# Patient Record
Sex: Female | Born: 1970 | Hispanic: Yes | Marital: Married | Smoking: Never smoker
Health system: Southern US, Community
[De-identification: ages and names within clinical notes are randomized; demographics above are authoritative.]

## PROBLEM LIST (undated history)

## (undated) DIAGNOSIS — I10 Essential (primary) hypertension: Secondary | ICD-10-CM

## (undated) DIAGNOSIS — E119 Type 2 diabetes mellitus without complications: Secondary | ICD-10-CM

## (undated) HISTORY — PX: HERNIA REPAIR: SHX51

---

## 1998-12-30 ENCOUNTER — Other Ambulatory Visit: Admission: RE | Admit: 1998-12-30 | Discharge: 1998-12-30 | Payer: Self-pay | Admitting: Obstetrics

## 1999-05-19 ENCOUNTER — Encounter: Admission: RE | Admit: 1999-05-19 | Discharge: 1999-05-19 | Payer: Self-pay | Admitting: Obstetrics & Gynecology

## 2014-10-01 ENCOUNTER — Encounter: Payer: Self-pay | Admitting: Family Medicine

## 2014-10-01 ENCOUNTER — Ambulatory Visit (INDEPENDENT_AMBULATORY_CARE_PROVIDER_SITE_OTHER): Payer: No Typology Code available for payment source | Admitting: Family Medicine

## 2014-10-01 VITALS — BP 125/82 | HR 70 | Temp 98.1°F | Resp 16 | Ht 66.0 in | Wt 188.0 lb

## 2014-10-01 DIAGNOSIS — R5383 Other fatigue: Secondary | ICD-10-CM

## 2014-10-01 DIAGNOSIS — Z789 Other specified health status: Secondary | ICD-10-CM | POA: Insufficient documentation

## 2014-10-01 DIAGNOSIS — N951 Menopausal and female climacteric states: Secondary | ICD-10-CM | POA: Insufficient documentation

## 2014-10-01 DIAGNOSIS — E8881 Metabolic syndrome: Secondary | ICD-10-CM

## 2014-10-01 DIAGNOSIS — R739 Hyperglycemia, unspecified: Secondary | ICD-10-CM

## 2014-10-01 DIAGNOSIS — Z23 Encounter for immunization: Secondary | ICD-10-CM

## 2014-10-01 LAB — COMPLETE METABOLIC PANEL WITH GFR
ALT: 15 U/L (ref 0–35)
AST: 14 U/L (ref 0–37)
Albumin: 4.2 g/dL (ref 3.5–5.2)
Alkaline Phosphatase: 82 U/L (ref 39–117)
BILIRUBIN TOTAL: 0.2 mg/dL (ref 0.2–1.2)
BUN: 13 mg/dL (ref 6–23)
CALCIUM: 9 mg/dL (ref 8.4–10.5)
CO2: 23 mEq/L (ref 19–32)
CREATININE: 0.54 mg/dL (ref 0.50–1.10)
Chloride: 101 mEq/L (ref 96–112)
Glucose, Bld: 164 mg/dL — ABNORMAL HIGH (ref 70–99)
POTASSIUM: 3.7 meq/L (ref 3.5–5.3)
Sodium: 135 mEq/L (ref 135–145)
TOTAL PROTEIN: 7.3 g/dL (ref 6.0–8.3)

## 2014-10-01 LAB — GLUCOSE, CAPILLARY: GLUCOSE-CAPILLARY: 153 mg/dL — AB (ref 70–99)

## 2014-10-01 LAB — LIPID PANEL
CHOL/HDL RATIO: 5.8 ratio
CHOLESTEROL: 238 mg/dL — AB (ref 0–200)
HDL: 41 mg/dL (ref >39)
TRIGLYCERIDES: 434 mg/dL — AB (ref <150)

## 2014-10-01 NOTE — Progress Notes (Signed)
Subjective:    Patient ID: Katrina [name], female    DOB: 02/06/1971, 44 y.o.   MRN: [id number]  HPI [name], a 44 year old female presents accompanied by spanish interpreter. She understands [place], but primarily speaks spanish. She states that she has been using the emergency department and urgent care for primary needs.  Patient presents complaining of hyperglycemia  Symptoms: hyperglycemia, polydipsia and polyuria. Symptoms have been intermittent. Patient denies nausea, visual disturbances, vomitting and weight loss. She states that she purchased a glucometer and blood sugars have been consistently increased.  Home sugars: BGs range between 170 and 200. She states that she has a strong family history of diabetes and had gestational diabetes during her last pregnancy.   Patient complains of fatigue. Symptoms began several months ago. Sentinal symptom the patient feels fatigue began with: significant change in weight. Symptoms of her fatigue have been diffuse soft tissue aches and pains, headaches and lack of interest in usual activities.  Symptoms have been intermittent. Severity has been symptoms bothersome, but easily able to carry out all usual work/school/family activities.   . History reviewed. No pertinent past medical history.  History   Social History  . Marital Status: Unknown    Spouse Name: N/A  . Number of Children: N/A  . Years of Education: N/A   Occupational History  . Not on file.   Social History Main Topics  . Smoking status: Never Smoker   . Smokeless tobacco: Not on file  . Alcohol Use: No  . Drug Use: Not on file  . Sexual Activity: Yes   Other Topics Concern  . Not on file   Social History Narrative  . No narrative on file   Review of Systems  Constitutional: Negative.   HENT: Negative.   Eyes: Negative.   Respiratory: Negative.   Cardiovascular: Negative.   Gastrointestinal: Negative.  Negative for vomiting and rectal pain.  Endocrine:  Positive for polydipsia, polyphagia and polyuria.  Genitourinary: Negative.  Negative for menstrual problem.  Musculoskeletal: Negative.   Skin: Negative.   Hematological: Negative.   Psychiatric/Behavioral: Negative.        Objective:   Physical Exam  Constitutional: She is oriented to person, place, and time. She appears well-developed and well-nourished.  HENT:  Head: Normocephalic and atraumatic.  Right Ear: External ear normal.  Left Ear: External ear normal.  Mouth/Throat: Oropharynx is clear and moist.  Eyes: Pupils are equal, round, and reactive to light.  Neck: Normal range of motion. Neck supple.  Cardiovascular: Normal rate, regular rhythm, normal heart sounds and intact distal pulses.   Pulmonary/Chest: Effort normal and breath sounds normal.  Abdominal: Soft. Bowel sounds are normal.  Neurological: She is alert and oriented to person, place, and time. She has normal reflexes.  Skin: Skin is warm and dry.  Psychiatric: She has a normal mood and affect. Her behavior is normal. Judgment and thought content normal.         BP 125/82 mmHg  Pulse 70  Temp(Src) 98.1 F (36.7 C) (Oral)  Resp 16  Ht 5\' 6"  (1.676 m)  Wt 188 lb (85.276 kg)  BMI 30.36 kg/m2  LMP 08/20/2014 Assessment & Plan:  1. Hyperglycemia [name] states that she had gestational diabetes during pregnancy and a strong family history of diabetes mellitus. She states that she has been feeling fatigued for the past several months. She reports polyuria,  Polydipsia, and  polyphagia. She purchased a glucometer from the pharmacy and her  blood sugars have averaged between 170 and 200 daily. I verify hemoglobin A1C and  add Metformin 500 mg BID daily to medication regimen.  - Hemoglobin A1c - Glucose (CBG) - Urinalysis, Complete  2.Other fatigue She states that she feels tired on most days. She denies depression and anxiety.PHq-9 within normal limits. She states that she sleeps 5-6 hours per night. She  does not exercise nor follow a routine diet or exercise plan. I will check labs. I recommend a lowfat, low carbohydrate diet divided over 6 small meals. I also recommend that Katrina [name] start a low impact cardiovascular exercise plan 2-3 times per week for 30 minutes. She was provided written information in Spanish on diet and exercise regimen.  - TSH - CBC with Differential   3. Metabolic syndrome Katrina [name] maintains that she has gained a significant amount of weight since the birth of her last child, who is currently 55 years old. I will check labs.  - TSH - Lipid Panel - COMPLETE METABOLIC PANEL WITH GFR  4. Need for Tdap vaccination - Tdap vaccine greater than or equal to 7yo IM  5. Need for prophylactic vaccination and inoculation against influenza  - Flu Vaccine QUAD 36+ mos PF IM (Fluarix Quad PF)  6. Language barrier to communication Katrina [name] is accompanied by interpreter, she understands some english and speaks very little.     RTC: 1 month hyperglycemia Katrina [name] M, FNP

## 2014-10-01 NOTE — Patient Instructions (Signed)
Nivel elevado de azcar en sangre (High Blood Sugar) El nivel elevado de azcar en la sangre (hiperglucemia) significa que es mayor de lo que debera ser. Entre los signos de hiperglucemia se incluyen:  Sensacin de sed.  Orinar con frecuencia.  Sentirse cansado o con sueo.  [employer].  Cambios en la visin.  Sensacin de debilidad.  Sensacin de [place], Biomedical engineer con prdida de [place].  Adormecimiento u hormigueo en las manos o en los pies.  Dolor de [place]. Si se ignoran estas seales, el azcar en la sangre puede seguir subiendo. Estos problemas Buyer, retail, y otros problemas pueden Games developer. CUIDADOS EN EL HOGAR  Controle los niveles de Production assistant, radio segn le haya indicado el mdico. Anote los valores con la fecha y la hora.  Tome la cantidad correcta de insulina o pldoras para la diabetes en el momento adecuado. Anote las dosis con la fecha y Public relations account executive.  Reponga la insulina o las pldoras para la diabetes antes de que se le acaben.  Controle lo que come. Siga el plan de alimentacin.  Beba lquidos sin azcar, como agua. Verifique con su mdico si tiene trastornos del rin o del corazn.  Siga las instrucciones del mdico para hacer ejercicio. Haga actividad fsica en el mismo momento del da.  Cumpla con las citas mdicas. SOLICITE AYUDA DE INMEDIATO SI:   Presenta dificultades para pensar o se siente confundido.  Tiene respiracin rpida y su aliento tiene [place] a frutas.  Se desmaya.  Lleva 2 a 3 das con [employer] de Banker y no sabe la razn.  Siente dolor en el pecho.  Tiene Programme researcher, broadcasting/film/video (nuseas) y vmitos.  Tiene cambios repentinos en la vista. ASEGRESE DE QUE:   Comprende estas instrucciones.  Controlar su enfermedad.  Solicitar ayuda de inmediato si no mejora o empeora. Document Released: 09/01/2010 Document Revised: 10/22/2011 [facility] Patient Information 2015 [place], [place]. This information is not intended to  replace advice given to you by your health care provider. Make sure you discuss any questions you have with your health care provider. Recuento bsico de carbohidratos para la diabetes mellitus (Basic Carbohydrate Counting for Diabetes Mellitus) El recuento de carbohidratos es un mtodo destinado a calcular la cantidad de carbohidratos en la dieta. El consumo de carbohidratos aumenta naturalmente el nivel de azcar (glucosa) en la sangre, por lo que es importante que sepa la cantidad que debe incluir en cada comida. El recuento de carbohidratos ayuda a Futures trader de glucosa en la sangre dentro de los lmites normales. La cantidad permitida de carbohidratos es diferente para cada persona. Un nutricionista puede ayudarlo a calcular la cantidad adecuada para usted. Una vez que sepa la cantidad de carbohidratos que puede consumir, podr calcular los carbohidratos de los alimentos que desea comer. Los siguientes alimentos incluyen carbohidratos:  Granos, como panes y cereales.  Frijoles secos y productos con soja.  Vegetales almidonados, como papas, guisantes y maz.  Nils Pyle y jugos de frutas.  Leche y Dentist.  Dulces y bocadillos, como pastel, galletas, caramelos, papas fritas de bolsa, refrescos y bebidas frutales con azcar. RECUENTO DE CARBOHIDRATOS [employer] de calcular los carbohidratos de los alimentos. Puede usar cualquiera de [address] o [place] combinacin de [place]. Leer la etiqueta de informacin nutricional de los alimentos envasados La informacin nutricional es una etiqueta incluida en casi todas las bebidas y los alimentos envasados de los [place]. Indica el tamao de la porcin de ese alimento o bebida  e informacin sobre los nutrientes de cada porcin, incluso los gramos (g) de carbohidratos por porcin.  Decida la cantidad de porciones que comer o tomar de este alimento o bebida. Multiplique la cantidad de porciones por el nmero de gramos de carbohidratos  indicados en la etiqueta para esa porcin. El total ser la cantidad de carbohidratos que consumir al comer ese alimento o tomar esa bebida. Conocer las porciones estndar de los alimentos Cuando coma alimentos no envasados o que no incluyan la informacin nutricional en la etiqueta, deber medir las porciones para poder calcular la cantidad de carbohidratos. Una porcin de la mayora de los alimentos ricos en carbohidratos contiene alrededor de 15g de carbohidratos. La siguiente [employer] tamaos de porcin de los alimentos ricos en carbohidratos que contienen alrededor de 15g de carbohidratos por porcin:   1rebanada de pan (1oz) o 1tortilla de seis pulgadas.  panecillo de hamburguesa o bollito tipo ingls.  4a 6galletas.   de taza de cereal sin azcar y seco.   taza de cereal caliente.   de taza de arroz o pastas.  taza de pur de papas o de una papa grande al horno.  1taza de frutas frescas o una fruta pequea.  taza de frutas o jugo de frutas enlatados o congelados.  1 taza [employer].   de taza de yogur descremado sin ningn agregado o de yogur endulzado con edulcorante artificial.  taza de vegetales almidonados, como guisantes, maz o papas, o de frijoles secos cocidos. Decida la cantidad de porciones Advertising copywriter. Multiplique la cantidad de porciones por 15 (los gramos de carbohidratos en esa porcin). Por ejemplo, si come 2tazas de fresas, habr comido 2porciones y 30g de carbohidratos (2porciones x 15g = 30g). Para las comidas como sopas y guisos, en las que se mezcla ms de un alimento, deber [place] [employer] carbohidratos de cada alimento incluido. EJEMPLO DE RECUENTO DE CARBOHIDRATOS Ejemplo de cena  3 onzas de pechugas de pollo.   de taza de arroz integral.   taza de maz.  1 taza de [place].  1 taza de fresas con crema batida sin azcar. Clculo de carbohidratos [place] 1: Identifique los alimentos que contienen carbohidratos:     Arroz.  Maz.  Leche.  Jinny Sanders. [place] 2: Calcule el nmero de porciones que consumir de cada uno:   2 porciones de Surveyor, minerals.  1 porcin de maz.  1 porcin de leche.  1 porcin de fresas. [place] 3: Multiplique cada una de esas porciones por 15g:   2 porciones de arroz x 15 g = 30 g.  1 porcin de maz x 15 g = 15 g.  1 porcin de leche x 15 g = 15 g.  1 porcin de fresas x 15 g = 15 g. [place] 4: Sume todas las cantidades para Artist total de gramos de carbohidratos consumidos: 30 g + 15 g + 15 g + 15 g = 75 g. Document Released: 10/22/2011 Document Revised: 12/14/2013 [facility] Patient Information 2015 [place], [place]. This information is not intended to replace advice given to you by your health care provider. Make sure you discuss any questions you have with your health care provider. Recuento bsico de carbohidratos para la diabetes mellitus (Basic Carbohydrate Counting for Diabetes Mellitus) El recuento de carbohidratos es un mtodo destinado a calcular la cantidad de carbohidratos en la dieta. El consumo de carbohidratos aumenta naturalmente el nivel de azcar (glucosa) en la sangre, por lo que es importante que sepa la cantidad que debe  incluir en cada comida. El recuento de carbohidratos ayuda a Futures trader de glucosa en la sangre dentro de los lmites normales. La cantidad permitida de carbohidratos es diferente para cada persona. Un nutricionista puede ayudarlo a calcular la cantidad adecuada para usted. Una vez que sepa la cantidad de carbohidratos que puede consumir, podr calcular los carbohidratos de los alimentos que desea comer. Los siguientes alimentos incluyen carbohidratos:  Granos, como panes y cereales.  Frijoles secos y productos con soja.  Vegetales almidonados, como papas, guisantes y maz.  Nils Pyle y jugos de frutas.  Leche y Dentist.  Dulces y bocadillos, como pastel, galletas, caramelos, papas fritas de bolsa, refrescos y bebidas frutales con  azcar. RECUENTO DE CARBOHIDRATOS [employer] de calcular los carbohidratos de los alimentos. Puede usar cualquiera de [address] o [place] combinacin de [place]. Leer la etiqueta de informacin nutricional de los alimentos envasados La informacin nutricional es una etiqueta incluida en casi todas las bebidas y los alimentos envasados de los [place]. [place] el tamao de la porcin de ese alimento o bebida e informacin sobre los nutrientes de cada porcin, incluso los gramos (g) de carbohidratos por porcin.  Decida la cantidad de porciones que comer o tomar de este alimento o bebida. Multiplique la cantidad de porciones por el nmero de gramos de carbohidratos indicados en la etiqueta para esa porcin. El total ser la cantidad de carbohidratos que consumir al comer ese alimento o tomar esa bebida. Conocer las porciones estndar de los alimentos Cuando coma alimentos no envasados o que no incluyan la informacin nutricional en la etiqueta, deber medir las porciones para poder calcular la cantidad de carbohidratos. Una porcin de la mayora de los alimentos ricos en carbohidratos contiene alrededor de 15g de carbohidratos. La siguiente [employer] tamaos de porcin de los alimentos ricos en carbohidratos que contienen alrededor de 15g de carbohidratos por porcin:   1rebanada de pan (1oz) o 1tortilla de seis pulgadas.  panecillo de hamburguesa o bollito tipo ingls.  4a 6galletas.   de taza de cereal sin azcar y seco.   taza de cereal caliente.   de taza de arroz o pastas.  taza de pur de papas o de una papa grande al horno.  1taza de frutas frescas o una fruta pequea.  taza de frutas o jugo de frutas enlatados o congelados.  1 taza [employer].   de taza de yogur descremado sin ningn agregado o de yogur endulzado con edulcorante artificial.  taza de vegetales almidonados, como guisantes, maz o papas, o de frijoles secos cocidos. Decida la  cantidad de porciones Advertising copywriter. Multiplique la cantidad de porciones por 15 (los gramos de carbohidratos en esa porcin). Por ejemplo, si come 2tazas de fresas, habr comido 2porciones y 30g de carbohidratos (2porciones x 15g = 30g). Para las comidas como sopas y guisos, en las que se mezcla ms de un alimento, deber [employer] carbohidratos de cada alimento incluido. EJEMPLO DE RECUENTO DE CARBOHIDRATOS Ejemplo de cena  3 onzas de pechugas de pollo.   de taza de arroz integral.   taza de maz.  1 taza de [place].  1 taza de fresas con crema batida sin azcar. Clculo de carbohidratos [place] 1: Identifique los alimentos que contienen carbohidratos:   Arroz.  Maz.  Leche.  Jinny Sanders. [place] 2: Calcule el nmero de porciones que consumir de cada uno:   2 porciones de Surveyor, minerals.  1 porcin de maz.  1 porcin de leche.  1  porcin de fresas. [place] 3: Multiplique cada una de esas porciones por 15g:   2 porciones de arroz x 15 g = 30 g.  1 porcin de maz x 15 g = 15 g.  1 porcin de leche x 15 g = 15 g.  1 porcin de fresas x 15 g = 15 g. [place] 4: Sume todas las cantidades para Artist total de gramos de carbohidratos consumidos: 30 g + 15 g + 15 g + 15 g = 75 g. Document Released: 10/22/2011 Document Revised: 12/14/2013 [facility] Patient Information 2015 [place], [place]. This information is not intended to replace advice given to you by your health care provider. Make sure you discuss any questions you have with your health care provider. Diabetes y Doroteo Glassman fsica (Diabetes and Exercise) Hacer actividad fsica con regularidad es muy importante. No se trata solo de [employer]. Tiene muchos otros beneficios, como por ejemplo:  Mejorar el estado fsico, la flexibilidad y la resistencia.  Aumenta la densidad sea.  Ayuda a Art gallery manager.  Disminuye la Art gallery manager.  Aumenta la fuerza muscular.  Reduce el estrs y las tensiones.  Mejora el estado  de salud general. Las personas diabticas que realizan actividad fsica tienen beneficios adicionales debido al ejercicio:  Reduce el apetito.  El organismo mejora el uso del azcar (glucosa) de la [place].  Ayuda a disminuir o Engineer, maintenance (IT).  Disminuye la presin arterial.  Ayuda a disminuir los lpidos en la sangre (colesterol y triglicridos).  El organismo mejora el uso de la insulina porque:  Aumenta la sensibilidad del organismo a la insulina.  Reduce las necesidades de insulina del organismo.  Disminuye el riesgo de enfermedad cardaca por la actividad fsica ya que  disminuye el colesterol y [employer] triglicridos.  Aumenta los niveles de colesterol bueno (como las lipoprotenas de alta densidad [HDL]) en el organismo.  Disminuye los niveles de glucosa en la [place]. SU PLAN DE ACTIVIDAD  Elija una actividad que disfrute y establezca objetivos realistas. Su mdico o educador en diabetes podrn ayudarlo a encontrar una actividad que lo beneficie. Haga ejercicio regularmente como se lo haya indicado el mdico. Esto incluye:  Hacer entrenamiento de [employer] a la semana, como flexiones, sentadillas, levantar peso o usar bandas de resistencia.  Practicar de ejercicios cardiovasculares cada semana, como caminar, correr o hacer algn deporte.  Mantenerse activo y no permanecer inactivo durante ms de seguidos. Los perodos cortos de [place] tambin son beneficiosos. Tres sesiones de a lo largo del da son tan beneficiosas como una sola sesin de . Estas son algunas ideas para los ejercicios:  Lleve a Multimedia programmer.  Utilice las Microbiologist del ascensor.  Baile su cancin favorita.  Haga los ejercicios de un video de ejercicios.  Haga sus ejercicios favoritos con Leisure centre manager. RECOMENDACIONES PARA REALIZAR EJERCICIOS CUANDO SE TIENE DIABETES TIPO 1 O TIPO 2   Controle la glucosa en la  sangre antes de comenzar. Si el nivel de glucosa en la sangre es de ms de 240 mg/dl, controle las cetonas en la [place]. No haga actividad fsica si hay cetonas.  Evite inyectarse insulina en las zonas del cuerpo que ejercitar. Por ejemplo, evite inyectarse insulina en:  Los brazos, si juega al tenis.  Las piernas, si corre.  Lleve un registro de:  Los alimentos que consume antes y despus de Tour manager.  Los momentos esperables de picos de accin de la insulina.  Los niveles de glucosa en la sangre antes y despus de hacer ejercicios.  El tipo y cantidad de [place] fsica que Biomedical engineer.  Revise los registros con su mdico. El mdico lo ayudar a Environmental education officer pautas para ajustar la cantidad de alimento y las cantidades de insulina antes y despus de Radio producer ejercicios.  Si toma insulina o agentes hipoglucemiantes por va oral, observe si hay signos y sntomas de hipoglucemia. Entre los que se incluyen:  Mareos.  Temblores.  Sudoracin.  Escalofros.  Confusin.  Beba gran cantidad de agua mientras hace ejercicios para evitar la deshidratacin o los golpes de Airline pilot. Durante la actividad fsica se pierde agua corporal que se debe reponer.  Comente con su mdico antes de comenzar un programa de actividad fsica para verificar que sea seguro para usted. Recuerde, cualquier actividad es mejor que ninguna. Document Released: 08/19/2007 Document Revised: 12/14/2013 [facility] Patient Information 2015 [place], [place]. This information is not intended to replace advice given to you by your health care provider. Make sure you discuss any questions you have with your health care provider. Diabetes y Doroteo Glassman fsica (Diabetes and Exercise) Hacer actividad fsica con regularidad es muy importante. No se trata solo de [employer]. Tiene muchos otros beneficios, como por ejemplo:  Mejorar el estado fsico, la flexibilidad y la resistencia.  Aumenta la densidad sea.  Ayuda a  Art gallery manager.  Disminuye la Art gallery manager.  Aumenta la fuerza muscular.  Reduce el estrs y las tensiones.  Mejora el estado de salud general. Las personas diabticas que realizan actividad fsica tienen beneficios adicionales debido al ejercicio:  Reduce el apetito.  El organismo mejora el uso del azcar (glucosa) de la [place].  Ayuda a disminuir o Engineer, maintenance (IT).  Disminuye la presin arterial.  Ayuda a disminuir los lpidos en la sangre (colesterol y triglicridos).  El organismo mejora el uso de la insulina porque:  Aumenta la sensibilidad del organismo a la insulina.  Reduce las necesidades de insulina del organismo.  Disminuye el riesgo de enfermedad cardaca por la actividad fsica ya que  disminuye el colesterol y [employer] triglicridos.  Aumenta los niveles de colesterol bueno (como las lipoprotenas de alta densidad [HDL]) en el organismo.  Disminuye los niveles de glucosa en la [place]. SU PLAN DE ACTIVIDAD  Elija una actividad que disfrute y establezca objetivos realistas. Su mdico o educador en diabetes podrn ayudarlo a encontrar una actividad que lo beneficie. Haga ejercicio regularmente como se lo haya indicado el mdico. Esto incluye:  Hacer entrenamiento de [employer] a la semana, como flexiones, sentadillas, levantar peso o usar bandas de resistencia.  Practicar de ejercicios cardiovasculares cada semana, como caminar, correr o hacer algn deporte.  Mantenerse activo y no permanecer inactivo durante ms de seguidos. Los perodos cortos de [place] tambin son beneficiosos. Tres sesiones de a lo largo del da son tan beneficiosas como una sola sesin de . Estas son algunas ideas para los ejercicios:  Lleve a Multimedia programmer.  Utilice las Microbiologist del ascensor.  Baile su cancin favorita.  Haga los ejercicios de un video de ejercicios.  Haga sus ejercicios  favoritos con Leisure centre manager. RECOMENDACIONES PARA REALIZAR EJERCICIOS CUANDO SE TIENE DIABETES TIPO 1 O TIPO 2   Controle la glucosa en la sangre antes de comenzar. Si el nivel de glucosa en la sangre es de ms de 240 mg/dl, controle las cetonas en la [place]. No haga actividad fsica si hay cetonas.  Evite inyectarse insulina en las zonas del cuerpo que ejercitar. Por ejemplo, evite inyectarse insulina en:  Los brazos, si juega al tenis.  Las piernas, si corre.  Lleve un registro de:  Los alimentos que consume antes y despus de Tour manager.  Los momentos esperables de picos de accin de la insulina.  Los niveles de glucosa en la sangre antes y despus de hacer ejercicios.  El tipo y cantidad de [place] fsica que Biomedical engineer.  Revise los registros con su mdico. El mdico lo ayudar a Environmental education officer pautas para ajustar la cantidad de alimento y las cantidades de insulina antes y despus de Radio producer ejercicios.  Si toma insulina o agentes hipoglucemiantes por va oral, observe si hay signos y sntomas de hipoglucemia. Entre los que se incluyen:  Mareos.  Temblores.  Sudoracin.  Escalofros.  Confusin.  Beba gran cantidad de agua mientras hace ejercicios para evitar la deshidratacin o los golpes de Airline pilot. Durante la actividad fsica se pierde agua corporal que se debe reponer.  Comente con su mdico antes de comenzar un programa de actividad fsica para verificar que sea seguro para usted. Recuerde, cualquier actividad es mejor que ninguna. Document Released: 08/19/2007 Document Revised: 12/14/2013 [facility] Patient Information 2015 [place], [place]. This information is not intended to replace advice given to you by your health care provider. Make sure you discuss any questions you have with your health care provider. La diabetes mellitus y los alimentos (Diabetes Mellitus and Food) Es importante que controle su nivel de azcar en la sangre (glucosa). El nivel de glucosa en sangre  depende en gran medida de lo que usted come. Comer alimentos saludables en las cantidades [place] a lo largo del Futures trader, aproximadamente a la misma hora [employer], lo ayudar a Chief Operating Officer su nivel de Event organiser. Tambin puede ayudarlo a retrasar o Fish farm manager de la diabetes mellitus. Comer de [employer] saludable incluso puede ayudarlo a Event organiser de presin arterial y a Barista o Pharmacologist un peso saludable.  CMO PUEDEN AFECTARME LOS ALIMENTOS? Carbohidratos Los carbohidratos afectan el nivel de glucosa en sangre ms que cualquier otro tipo de alimento. El nutricionista lo ayudar a Chief Strategy Officer cuntos carbohidratos puede consumir en cada comida y ensearle a contarlos. El recuento de carbohidratos es importante para mantener la glucosa en sangre en un nivel saludable, en especial si utiliza insulina o toma determinados medicamentos para la diabetes mellitus. Alcohol El alcohol puede provocar disminuciones sbitas de la glucosa en sangre (hipoglucemia), en especial si utiliza insulina o toma determinados medicamentos para la diabetes mellitus. La hipoglucemia es una afeccin que puede poner en peligro la vida. Los sntomas de la hipoglucemia (somnolencia, mareos y Administrator) son similares a los sntomas de haber consumido mucho alcohol.  Si el mdico lo autoriza a beber alcohol, hgalo con moderacin y siga estas pautas:  Las mujeres no deben beber ms de un trago por da, y los hombres no deben beber ms de dos tragos por Futures trader. Un trago es igual a:  12 onzas (355 ml) de cerveza  5 onzas de vino (150 ml) de vino  1,5onzas (45ml) de bebidas espirituosas  No beba con el estmago vaco.  Mantngase hidratado. Beba agua, gaseosas dietticas o t helado sin azcar.  Las gaseosas comunes, los jugos y otros refrescos podran contener muchos carbohidratos y se Heritage manager. QU ALIMENTOS NO SE RECOMIENDAN? Cuando haga las elecciones de alimentos, es importante que  recuerde que todos los alimentos son distintos. Algunos tienen menos nutrientes  que otros por porcin, aunque podran tener la misma cantidad de caloras o carbohidratos. Es difcil darle al cuerpo lo que necesita cuando consume alimentos con menos nutrientes. Estos son algunos ejemplos de alimentos que debera evitar ya que contienen muchas caloras y carbohidratos, pero pocos nutrientes:  Neurosurgeon trans (la mayora de los alimentos procesados incluyen grasas trans en la etiqueta de Informacin nutricional).  Gaseosas comunes.  Jugos.  Caramelos.  Dulces, como tortas, pasteles, rosquillas y [place].  Comidas fritas. QU ALIMENTOS PUEDO COMER? Consuma alimentos ricos en nutrientes, que nutrirn el cuerpo y lo mantendrn saludable. Los alimentos que debe comer tambin dependern de varios factores, como:  Las caloras que necesita.  Los medicamentos que toma.  Su peso.  El nivel de glucosa en [place].  El [place] de presin arterial.  El nivel de colesterol. Tambin debe consumir una variedad de [place], como:  Protenas, como carne, aves, pescado, tofu, frutos secos y semillas (las protenas de [place] magros son mejores).  Nils Pyle.  Verduras.  Productos lcteos, como [place], queso y yogur (descremados son mejores).  Panes, granos, pastas, cereales, arroz y frijoles.  Grasas, como aceite de [place], [place] sin grasas trans, aceite de canola, aguacate y [place]. TODOS LOS QUE PADECEN DIABETES MELLITUS TIENEN EL MISMO PLAN DE COMIDAS? Dado que todas las personas que padecen diabetes mellitus son distintas, no hay un solo plan de comidas que funcione para todos. Es muy importante que se rena con un nutricionista que lo ayudar a crear un plan de comidas adecuado para usted. Document Released: 11/06/2007 Document Revised: 08/04/2013 [facility] Patient Information 2015 [place], [place]. This information is not intended to replace advice given to you by your health care provider.  Make sure you discuss any questions you have with your health care provider. La diabetes mellitus y los alimentos (Diabetes Mellitus and Food) Es importante que controle su nivel de azcar en la sangre (glucosa). El nivel de glucosa en sangre depende en gran medida de lo que usted come. Comer alimentos saludables en las cantidades [place] a lo largo del Futures trader, aproximadamente a la misma hora [employer], lo ayudar a Chief Operating Officer su nivel de Event organiser. Tambin puede ayudarlo a retrasar o Fish farm manager de la diabetes mellitus. Comer de [employer] saludable incluso puede ayudarlo a Event organiser de presin arterial y a Barista o Pharmacologist un peso saludable.  CMO PUEDEN AFECTARME LOS ALIMENTOS? Carbohidratos Los carbohidratos afectan el nivel de glucosa en sangre ms que cualquier otro tipo de alimento. El nutricionista lo ayudar a Chief Strategy Officer cuntos carbohidratos puede consumir en cada comida y ensearle a contarlos. El recuento de carbohidratos es importante para mantener la glucosa en sangre en un nivel saludable, en especial si utiliza insulina o toma determinados medicamentos para la diabetes mellitus. Alcohol El alcohol puede provocar disminuciones sbitas de la glucosa en sangre (hipoglucemia), en especial si utiliza insulina o toma determinados medicamentos para la diabetes mellitus. La hipoglucemia es una afeccin que puede poner en peligro la vida. Los sntomas de la hipoglucemia (somnolencia, mareos y Administrator) son similares a los sntomas de haber consumido mucho alcohol.  Si el mdico lo autoriza a beber alcohol, hgalo con moderacin y siga estas pautas:  Las mujeres no deben beber ms de un trago por da, y los hombres no deben beber ms de dos tragos por Futures trader. Un trago es igual a:  12 onzas (355 ml) de cerveza  5 onzas de vino (150 ml) de vino  1,5onzas (45ml) de bebidas espirituosas  No beba con el estmago vaco.  Mantngase hidratado. Beba agua,  gaseosas dietticas o t helado sin azcar.  Las gaseosas comunes, los jugos y otros refrescos podran contener muchos carbohidratos y se Heritage manager. QU ALIMENTOS NO SE RECOMIENDAN? Cuando haga las elecciones de alimentos, es importante que recuerde que todos los alimentos son distintos. Algunos tienen menos nutrientes que otros por porcin, aunque podran tener la misma cantidad de caloras o carbohidratos. Es difcil darle al cuerpo lo que necesita cuando consume alimentos con menos nutrientes. Estos son algunos ejemplos de alimentos que debera evitar ya que contienen muchas caloras y carbohidratos, pero pocos nutrientes:  Neurosurgeon trans (la mayora de los alimentos procesados incluyen grasas trans en la etiqueta de Informacin nutricional).  Gaseosas comunes.  Jugos.  Caramelos.  Dulces, como tortas, pasteles, rosquillas y [place].  Comidas fritas. QU ALIMENTOS PUEDO COMER? Consuma alimentos ricos en nutrientes, que nutrirn el cuerpo y lo mantendrn saludable. Los alimentos que debe comer tambin dependern de varios factores, como:  Las caloras que necesita.  Los medicamentos que toma.  Su peso.  El nivel de glucosa en [place].  El [place] de presin arterial.  El nivel de colesterol. Tambin debe consumir una variedad de [place], como:  Protenas, como carne, aves, pescado, tofu, frutos secos y semillas (las protenas de [place] magros son mejores).  Nils Pyle.  Verduras.  Productos lcteos, como [place], queso y yogur (descremados son mejores).  Panes, granos, pastas, cereales, arroz y frijoles.  Grasas, como aceite de [place], [place] sin grasas trans, aceite de canola, aguacate y [place]. TODOS LOS QUE PADECEN DIABETES MELLITUS TIENEN EL MISMO PLAN DE COMIDAS? Dado que todas las personas que padecen diabetes mellitus son distintas, no hay un solo plan de comidas que funcione para todos. Es muy importante que se rena con un nutricionista que lo ayudar a  crear un plan de comidas adecuado para usted. Document Released: 11/06/2007 Document Revised: 08/04/2013 [facility] Patient Information 2015 [place], [place]. This information is not intended to replace advice given to you by your health care provider. Make sure you discuss any questions you have with your health care provider. Registro diario de la diabetes (Daily Diabetes Record) Contrlese el nivel sanguneo de glucosa segn las indicaciones de su mdico. Utilice este formulario para registrar los [place] y cualquier medicamento para la diabetes que tome, incluyendo insulina. Controlarse el nivel sanguneo de glucosa, Museum/gallery exhibitions officer y [employer] registros a su mdico es muy til para Chief Operating Officer la diabetes. Estos nmeros ayudan al mdico a saber si se necesita algn cambio en su plan de la diabetes.  Semana de _____________________________ Franco Nones: _________  Donnie Mesa sanguneo de glucosa/Medicamentos: ________________ / __________________________________________________________  Helane Gunther sanguneo de glucosa/Medicamentos: ____________________ / __________________________________________________________  Carlisle Beers sanguneo de glucosa/Medicamentos: ___________________ / __________________________________________________________  Mammie Russian DE IRSE A LA CAMA, Nivel sanguneo de glucosa/Medicamentos: __________________ / __________________________________________________________ Franco Nones: _________  Donnie Mesa sanguneo de glucosa/Medicamentos: ________________ / __________________________________________________________  Helane Gunther sanguneo de glucosa/Medicamentos: ____________________ / __________________________________________________________  Carlisle Beers sanguneo de glucosa/Medicamentos: ___________________ / __________________________________________________________  Mammie Russian DE IRSE A LA CAMA, Nivel sanguneo de glucosa/Medicamentos: __________________ /  __________________________________________________________ Franco Nones: _________  Donnie Mesa sanguneo de glucosa/Medicamentos: ________________ / __________________________________________________________  Helane Gunther sanguneo de glucosa/Medicamentos: ____________________ / __________________________________________________________  Carlisle Beers sanguneo de glucosa/Medicamentos: ___________________ / __________________________________________________________  Mammie Russian DE IRSE A LA CAMA, Nivel sanguneo de glucosa/Medicamentos: __________________ / __________________________________________________________ Franco Nones: _________  Donnie Mesa sanguneo de glucosa/Medicamentos: ________________ / __________________________________________________________  Helane Gunther sanguneo de glucosa/Medicamentos: ____________________ / __________________________________________________________  Carlisle Beers sanguneo de  glucosa/Medicamentos: ___________________ / __________________________________________________________  Mammie RussianHORA DE IRSE A LA CAMA, Nivel sanguneo de glucosa/Medicamentos: __________________ / __________________________________________________________ Franco NonesFecha: _________  Donnie MesaESAYUNO, Nivel sanguneo de glucosa/Medicamentos: ________________ / __________________________________________________________  Helane GuntherALMUERZO, Nivel sanguneo de glucosa/Medicamentos: ____________________ / __________________________________________________________  Carlisle BeersENA, Nivel sanguneo de glucosa/Medicamentos: ___________________ / __________________________________________________________  Mammie RussianHORA DE IRSE A LA CAMA, Nivel sanguneo de glucosa/Medicamentos: __________________ / __________________________________________________________ Franco NonesFecha: _________  Donnie MesaESAYUNO, Nivel sanguneo de glucosa/Medicamentos: ________________ / __________________________________________________________  Helane GuntherALMUERZO, Nivel sanguneo de  glucosa/Medicamentos: ____________________ / __________________________________________________________  Carlisle BeersENA, Nivel sanguneo de glucosa/Medicamentos: ___________________ / __________________________________________________________  Mammie RussianHORA DE IRSE A LA CAMA, Nivel sanguneo de glucosa/Medicamentos: __________________ / __________________________________________________________ Franco NonesFecha: _________  Donnie MesaESAYUNO, Nivel sanguneo de glucosa/Medicamentos: ________________ / __________________________________________________________  Mora ApplALMUERZO, Nivel sanguneo de glucosa/Medicamentos: ____________________ / __________________________________________________________  Carlisle BeersENA, Nivel sanguneo de glucosa/Medicamentos: ___________________ / __________________________________________________________  Mammie RussianHORA DE IRSE A LA CAMA, Nivel sanguneo de glucosa/Medicamentos: __________________ / __________________________________________________________ Notas: __________________________________________________________________________________________________ Document Released: 07/12/2008 Document Revised: 12/14/2013 ExitCare Patient Information 2015 [place]. This information is not intended to replace advice given to you by your health care provider. Make sure you discuss any questions you have with your health care provider. Registro diario de la diabetes (Daily Diabetes Record) Contrlese el nivel sanguneo de glucosa segn las indicaciones de su mdico. Utilice este formulario para registrar los [place]ados y cualquier medicamento para la diabetes que tome, incluyendo insulina. Controlarse el nivel sanguneo de glucosa, Museum/gallery exhibitions officerregistrarlo y [employer] Corporationllevarle los registros a su mdico es muy til para Chief Operating Officercontrolar la diabetes. Estos nmeros ayudan al mdico a saber si se necesita algn cambio en su plan de la diabetes.  Semana de _____________________________ Franco NonesFecha: _________  Donnie MesaESAYUNO, Nivel sanguneo de glucosa/Medicamentos:  ________________ / __________________________________________________________  Helane GuntherALMUERZO, Nivel sanguneo de glucosa/Medicamentos: ____________________ / __________________________________________________________  Carlisle BeersENA, Nivel sanguneo de glucosa/Medicamentos: ___________________ / __________________________________________________________  Mammie RussianHORA DE IRSE A LA CAMA, Nivel sanguneo de glucosa/Medicamentos: __________________ / __________________________________________________________ Franco NonesFecha: _________  Donnie MesaESAYUNO, Nivel sanguneo de glucosa/Medicamentos: ________________ / __________________________________________________________  Helane GuntherALMUERZO, Nivel sanguneo de glucosa/Medicamentos: ____________________ / __________________________________________________________  Carlisle BeersENA, Nivel sanguneo de glucosa/Medicamentos: ___________________ / __________________________________________________________  Mammie RussianHORA DE IRSE A LA CAMA, Nivel sanguneo de glucosa/Medicamentos: __________________ / __________________________________________________________ Franco NonesFecha: _________  Donnie MesaESAYUNO, Nivel sanguneo de glucosa/Medicamentos: ________________ / __________________________________________________________  Helane GuntherALMUERZO, Nivel sanguneo de glucosa/Medicamentos: ____________________ / __________________________________________________________  Carlisle BeersENA, Nivel sanguneo de glucosa/Medicamentos: ___________________ / __________________________________________________________  Mammie RussianHORA DE IRSE A LA CAMA, Nivel sanguneo de glucosa/Medicamentos: __________________ / __________________________________________________________ Franco NonesFecha: _________  Donnie MesaESAYUNO, Nivel sanguneo de glucosa/Medicamentos: ________________ / __________________________________________________________  Helane GuntherALMUERZO, Nivel sanguneo de glucosa/Medicamentos: ____________________ / __________________________________________________________  Carlisle BeersENA, Nivel sanguneo de  glucosa/Medicamentos: ___________________ / __________________________________________________________  Mammie RussianHORA DE IRSE A LA CAMA, Nivel sanguneo de glucosa/Medicamentos: __________________ / __________________________________________________________ Franco NonesFecha: _________  Donnie MesaESAYUNO, Nivel sanguneo de glucosa/Medicamentos: ________________ / __________________________________________________________  Helane GuntherALMUERZO, Nivel sanguneo de glucosa/Medicamentos: ____________________ / __________________________________________________________  Carlisle BeersENA, Nivel sanguneo de glucosa/Medicamentos: ___________________ / __________________________________________________________  Mammie RussianHORA DE IRSE A LA CAMA, Nivel sanguneo de glucosa/Medicamentos: __________________ / __________________________________________________________ Franco NonesFecha: _________  Donnie MesaESAYUNO, Nivel sanguneo de glucosa/Medicamentos: ________________ / __________________________________________________________  Helane GuntherALMUERZO, Nivel sanguneo de glucosa/Medicamentos: ____________________ / __________________________________________________________  Carlisle BeersENA, Nivel sanguneo de glucosa/Medicamentos: ___________________ / __________________________________________________________  Mammie RussianHORA DE IRSE A LA CAMA, Nivel sanguneo de glucosa/Medicamentos: __________________ / __________________________________________________________ Franco NonesFecha: _________  Donnie MesaESAYUNO, Nivel sanguneo de glucosa/Medicamentos: ________________ / __________________________________________________________  Helane GuntherALMUERZO, Nivel sanguneo de glucosa/Medicamentos: ____________________ / __________________________________________________________  Carlisle BeersENA, Nivel sanguneo de glucosa/Medicamentos: ___________________ / __________________________________________________________  Mammie RussianHORA DE IRSE A LA CAMA, Nivel sanguneo de glucosa/Medicamentos: __________________ /  __________________________________________________________ Notas: __________________________________________________________________________________________________ Document Released: 07/12/2008 Document Revised: 12/14/2013 ExitCare Patient Information 2015 [place]Care, [place]. This information is not intended to replace advice given to you  by your health care provider. Make sure you discuss any questions you have with your health care provider.  

## 2014-10-02 LAB — URINALYSIS, COMPLETE
BILIRUBIN URINE: NEGATIVE
CASTS: NONE SEEN
Crystals: NONE SEEN
Glucose, UA: >1000 mg/dL — AB
Hgb urine dipstick: NEGATIVE
KETONES UR: NEGATIVE mg/dL
LEUKOCYTES UA: NEGATIVE
NITRITE: NEGATIVE
Protein, ur: NEGATIVE mg/dL
Squamous Epithelial / LPF: NONE SEEN
Urobilinogen, UA: 0.2 mg/dL (ref 0.0–1.0)
pH: 5.5 (ref 5.0–8.0)

## 2014-10-02 LAB — HEMOGLOBIN A1C
HEMOGLOBIN A1C: 8.6 % — AB (ref <5.7)
Mean Plasma Glucose: 200 mg/dL — ABNORMAL HIGH (ref <117)

## 2014-10-02 LAB — TSH: TSH: 1.138 u[IU]/mL (ref 0.350–4.500)

## 2014-10-04 ENCOUNTER — Telehealth: Payer: Self-pay | Admitting: Family Medicine

## 2014-10-04 DIAGNOSIS — IMO0002 Reserved for concepts with insufficient information to code with codable children: Secondary | ICD-10-CM | POA: Insufficient documentation

## 2014-10-04 DIAGNOSIS — E785 Hyperlipidemia, unspecified: Secondary | ICD-10-CM

## 2014-10-04 DIAGNOSIS — E1165 Type 2 diabetes mellitus with hyperglycemia: Secondary | ICD-10-CM

## 2014-10-04 MED ORDER — ATORVASTATIN CALCIUM 20 MG PO TABS: 20.0000 mg | ORAL_TABLET | Freq: Every day | ORAL | Status: DC

## 2014-10-04 MED ORDER — METFORMIN HCL 500 MG PO TABS: 500.0000 mg | ORAL_TABLET | Freq: Two times a day (BID) | ORAL | Status: DC

## 2014-10-04 MED ORDER — LISINOPRIL 5 MG PO TABS: 5.0000 mg | ORAL_TABLET | Freq: Every day | ORAL | Status: DC

## 2014-10-04 NOTE — Telephone Encounter (Signed)
Reviewed laboratory values. Provided patient with carbohydrate modified diet in Spanish on 10/01/2014.  Patient will follow up in office in 1 month.   Meds ordered this encounter  Medications  . metFORMIN (GLUCOPHAGE) 500 MG tablet    Sig: Take 1 tablet (500 mg total) by mouth 2 (two) times daily with a meal.    Dispense:  60 tablet    Refill:  0    Order Specific Question:  Supervising Provider    Answer:  Jacqulyn LinerMATHEWS, MICHELLE [name] [[id number]]  . lisinopril (PRINIVIL,ZESTRIL) 5 MG tablet    Sig: Take 1 tablet (5 mg total) by mouth daily.    Dispense:  30 tablet    Refill:  0  . atorvastatin (LIPITOR) 20 MG tablet    Sig: Take 1 tablet (20 mg total) by mouth daily.    Dispense:  30 tablet    Refill:  3    Order Specific Question:  Supervising Provider    Answer:  Marthann SchillerMATTHEWS, MICHELLE A [[id number]]    Katrina [name],Katrina M, FNP

## 2014-10-05 NOTE — Telephone Encounter (Signed)
Called and spoke to daughter and patient on speaker phone. Patients daughter interpreted, explained to patients daughter need for new medications and that [facility]. Explained the need for patient to follow carbohydrate diet and scheduled appointment for patient to follow up in our office for 1 month for labs. Thanks!

## 2014-10-06 DIAGNOSIS — Z23 Encounter for immunization: Secondary | ICD-10-CM | POA: Insufficient documentation

## 2014-10-26 ENCOUNTER — Ambulatory Visit: Payer: No Typology Code available for payment source | Admitting: Family Medicine

## 2014-10-29 ENCOUNTER — Ambulatory Visit (INDEPENDENT_AMBULATORY_CARE_PROVIDER_SITE_OTHER): Payer: No Typology Code available for payment source | Admitting: Family Medicine

## 2014-10-29 VITALS — BP 106/67 | HR 62 | Temp 98.0°F | Resp 16 | Ht 66.0 in | Wt 187.0 lb

## 2014-10-29 DIAGNOSIS — E785 Hyperlipidemia, unspecified: Secondary | ICD-10-CM

## 2014-10-29 DIAGNOSIS — IMO0002 Reserved for concepts with insufficient information to code with codable children: Secondary | ICD-10-CM

## 2014-10-29 DIAGNOSIS — E1165 Type 2 diabetes mellitus with hyperglycemia: Secondary | ICD-10-CM

## 2014-10-29 DIAGNOSIS — Z789 Other specified health status: Secondary | ICD-10-CM

## 2014-10-29 LAB — GLUCOSE, CAPILLARY: GLUCOSE-CAPILLARY: 146 mg/dL — AB (ref 70–99)

## 2014-10-29 MED ORDER — METFORMIN HCL ER (MOD) 1000 MG PO TB24: 1000.0000 mg | ORAL_TABLET | Freq: Every day | ORAL | Status: DC

## 2014-10-29 NOTE — Progress Notes (Signed)
Subjective:    Patient ID: Katrina [name], female    DOB: 30-Aug-1970, 44 y.o.   MRN: [id number]  HPI Ms. Andrianna Manalang, a 44 year old female with a history of diabetes mellitus type 2 and hyperlipidemia presents accompanied by spanish interpreter for 1 month follow up of diabetes mellitus and hyperlipidemia. She understands [place], but primarily speaks spanish.   Patient complaining of diabetes mellitus.  Symptoms have improved, yet patient reports that she stopped taking medications 4 days ago due to . Patient denies nausea, visual disturbances, vomitting and weight loss. She states that she previously  purchased a glucometer and blood sugars have been consistently increased.  Home sugars: blood glucose 130-140.   She denies headache, fatigue, chest pain, dizziness, nausea, vomiting, and diarrhea.    . No past medical history on file.  History   Social History  . Marital Status: Unknown    Spouse Name: N/A  . Number of Children: N/A  . Years of Education: N/A   Occupational History  . Not on file.   Social History Main Topics  . Smoking status: Never Smoker   . Smokeless tobacco: Not on file  . Alcohol Use: No  . Drug Use: Not on file  . Sexual Activity: Yes   Other Topics Concern  . Not on file   Social History Narrative  . No narrative on file   Review of Systems  Constitutional: Negative.  Negative for fatigue.  HENT: Negative.   Eyes: Negative.   Respiratory: Negative.   Cardiovascular: Negative.   Gastrointestinal: Negative.  Negative for vomiting and rectal pain.  Endocrine: Negative for polydipsia, polyphagia and polyuria.  Genitourinary: Negative.  Negative for menstrual problem.  Musculoskeletal: Negative.   Skin: Negative.   Hematological: Negative.   Psychiatric/Behavioral: Negative.        Objective:   Physical Exam  Constitutional: She is oriented to person, place, and time. She appears well-developed and well-nourished.  HENT:  Head:  Normocephalic and atraumatic.  Right Ear: External ear normal.  Left Ear: External ear normal.  Mouth/Throat: Oropharynx is clear and moist.  Eyes: Pupils are equal, round, and reactive to light.  Neck: Normal range of motion. Neck supple.  Cardiovascular: Normal rate, regular rhythm, normal heart sounds and intact distal pulses.   Pulmonary/Chest: Effort normal and breath sounds normal.  Abdominal: Soft. Bowel sounds are normal.  Neurological: She is alert and oriented to person, place, and time. She has normal reflexes.  Skin: Skin is warm and dry.  Psychiatric: She has a normal mood and affect. Her behavior is normal. Judgment and thought content normal.         BP 106/67 mmHg  Pulse 62  Temp(Src) 98 F (36.7 C) (Oral)  Resp 16  Ht  (1.676 m)  Wt 187 lb (84.823 kg)  BMI 30.20 kg/m2  LMP 10/16/2014 Assessment & Plan:  1. Diabetes mellitus type 2, uncontrolled Discussed medication regimen at length, will start Metformin extended release to promote compliance. Patient is continuously not taking evening dose.  - metFORMIN (GLUMETZA) 1000 MG (MOD) 24 hr tablet; Take 1 tablet (1,000 mg total) by mouth daily with breakfast.  Dispense: 30 tablet; Refill: 2  2. Dyslipidemia Patient states that she had swelling after taking Atorvastatin. She discontinued medication 4 days ago. She states that swelling and lower extremity pain has improved. Recommend a lowfat diet and exercise regimen for 6 months  3. Language barrier to communication Spanish interpreter utilized during appointment to assist  with communication.    RTC: 3 months  Tanai Bouler M, FNP

## 2014-11-03 ENCOUNTER — Encounter: Payer: Self-pay | Admitting: Family Medicine

## 2014-11-29 ENCOUNTER — Ambulatory Visit: Payer: No Typology Code available for payment source | Admitting: Family Medicine

## 2014-12-07 ENCOUNTER — Ambulatory Visit (INDEPENDENT_AMBULATORY_CARE_PROVIDER_SITE_OTHER): Payer: No Typology Code available for payment source | Admitting: Family Medicine

## 2014-12-07 ENCOUNTER — Encounter: Payer: Self-pay | Admitting: Family Medicine

## 2014-12-07 VITALS — BP 109/71 | HR 61 | Temp 98.1°F | Resp 16 | Ht 66.0 in | Wt 185.0 lb

## 2014-12-07 DIAGNOSIS — IMO0002 Reserved for concepts with insufficient information to code with codable children: Secondary | ICD-10-CM

## 2014-12-07 DIAGNOSIS — Z789 Other specified health status: Secondary | ICD-10-CM

## 2014-12-07 DIAGNOSIS — E1165 Type 2 diabetes mellitus with hyperglycemia: Secondary | ICD-10-CM

## 2014-12-07 LAB — POCT URINALYSIS DIP (DEVICE)
Bilirubin Urine: NEGATIVE
GLUCOSE, UA: NEGATIVE mg/dL
Ketones, ur: NEGATIVE mg/dL
Leukocytes, UA: NEGATIVE
NITRITE: NEGATIVE
PROTEIN: NEGATIVE mg/dL
Specific Gravity, Urine: 1.025 (ref 1.005–1.030)
Urobilinogen, UA: 1 mg/dL (ref 0.0–1.0)
pH: 5.5 (ref 5.0–8.0)

## 2014-12-07 LAB — HEMOGLOBIN A1C
HEMOGLOBIN A1C: 8.3 % — AB (ref <5.7)
Mean Plasma Glucose: 192 mg/dL — ABNORMAL HIGH (ref <117)

## 2014-12-07 LAB — GLUCOSE, CAPILLARY: Glucose-Capillary: 95 mg/dL (ref 70–99)

## 2014-12-07 MED ORDER — LISINOPRIL 2.5 MG PO TABS: 2.5000 mg | ORAL_TABLET | Freq: Every day | ORAL | Status: DC

## 2014-12-07 NOTE — Progress Notes (Signed)
   Subjective:    Patient ID: Katrina [name], female    DOB: 05/21/1971, 44 y.o.   MRN: [id number]  HPI Ms. Katrina LaurenceBlanca [name], a 44 year old female with a history of diabetes mellitus type 2 and hyperlipidemia presents accompanied by spanish interpreter for 3 month follow up of diabetes mellitus. She understands [place], but primarily speaks spanish.  Patient complaining of diabetes mellitus. Symptoms have improved . Patient denies nausea, visual disturbances, vomitting and weight loss. She states that she previously purchased a glucometer and blood sugars have been consistently between 90-120.   She denies headache, fatigue, chest pain, dizziness, nausea, vomiting, and diarrhea.   No past medical history on file. History   Social History  . Marital Status: Unknown    Spouse Name: N/A  . Number of Children: N/A  . Years of Education: N/A   Occupational History  . Not on file.   Social History Main Topics  . Smoking status: Never Smoker   . Smokeless tobacco: Not on file  . Alcohol Use: No  . Drug Use: Not on file  . Sexual Activity: Yes   Other Topics Concern  . Not on file   Social History Narrative  No Known Allergies Review of Systems  Constitutional: Negative.   HENT: Negative.   Eyes: Negative.   Respiratory: Negative.   Cardiovascular: Negative.   Gastrointestinal: Negative.   Endocrine: Negative.   Genitourinary: Negative.   Musculoskeletal: Negative.   Skin: Negative.   Allergic/Immunologic: Negative.   Neurological: Negative.   Hematological: Negative.   Psychiatric/Behavioral: Negative.        Objective:   Physical Exam  Constitutional: She is oriented to person, place, and time. She appears well-developed and well-nourished.  HENT:  Head: Normocephalic and atraumatic.  Right Ear: External ear normal.  Left Ear: External ear normal.  Mouth/Throat: Oropharynx is clear and moist.  Eyes: Conjunctivae and EOM are normal. Pupils are equal, round, and  reactive to light.  Neck: Normal range of motion. Neck supple.  Cardiovascular: Normal rate, regular rhythm, normal heart sounds and intact distal pulses.   Pulmonary/Chest: Effort normal and breath sounds normal.  Abdominal: Soft. Bowel sounds are normal.  Musculoskeletal: Normal range of motion.  Neurological: She is alert and oriented to person, place, and time. She has normal reflexes.  Skin: Skin is warm and dry.  Psychiatric: She has a normal mood and affect. Her behavior is normal. Judgment and thought content normal.         BP 109/71 mmHg  Pulse 61  Temp(Src) 98.1 F (36.7 C) (Oral)  Resp 16  Ht 5\' 6"  (1.676 m)  Wt 185 lb (83.915 kg)  BMI 29.87 kg/m2  LMP 11/12/2014 Assessment & Plan:  1. Diabetes mellitus type 2, uncontrolled Patient states that she has been taking medication consistently. Previous hemoglobin A1C was 8.6, will repeat hemoglobin A1C. Current CBG is 95. Will check for glucosuria due to previous urinalysis results.  - Glucose (CBG) - Hemoglobin A1c - Basic Metabolic Panel - POCT urinalysis dipstick - lisinopril (PRINIVIL,ZESTRIL) 2.5 MG tablet; Take 1 tablet (2.5 mg total) by mouth daily.  Dispense: 30 tablet; Refill: 2   2. Language barrier to communication Spanish interpreter utilized during appointment to assist with communication.    RTC: 3 months for Diabetes mellitus type 2 with Dr. Wynona CanesMatthews Chevelle Durr M, FNP

## 2014-12-08 LAB — BASIC METABOLIC PANEL
BUN: 11 mg/dL (ref 6–23)
CHLORIDE: 103 meq/L (ref 96–112)
CO2: 23 mEq/L (ref 19–32)
Calcium: 9.1 mg/dL (ref 8.4–10.5)
Creat: 0.49 mg/dL — ABNORMAL LOW (ref 0.50–1.10)
Glucose, Bld: 83 mg/dL (ref 70–99)
POTASSIUM: 3.6 meq/L (ref 3.5–5.3)
Sodium: 139 mEq/L (ref 135–145)

## 2015-01-03 ENCOUNTER — Ambulatory Visit: Payer: No Typology Code available for payment source | Admitting: Internal Medicine

## 2015-01-22 ENCOUNTER — Emergency Department (HOSPITAL_COMMUNITY)
Admission: EM | Admit: 2015-01-22 | Discharge: 2015-01-22 | Disposition: A | Discharge status: Home / Self Care | Payer: Self-pay | Attending: Emergency Medicine | Admitting: Emergency Medicine

## 2015-01-22 ENCOUNTER — Emergency Department (HOSPITAL_COMMUNITY): Payer: Self-pay

## 2015-01-22 ENCOUNTER — Encounter (HOSPITAL_COMMUNITY): Payer: Self-pay | Admitting: Emergency Medicine

## 2015-01-22 DIAGNOSIS — S3991XA Unspecified injury of abdomen, initial encounter: Secondary | ICD-10-CM | POA: Insufficient documentation

## 2015-01-22 DIAGNOSIS — S2231XA Fracture of one rib, right side, initial encounter for closed fracture: Secondary | ICD-10-CM | POA: Insufficient documentation

## 2015-01-22 DIAGNOSIS — E119 Type 2 diabetes mellitus without complications: Secondary | ICD-10-CM | POA: Insufficient documentation

## 2015-01-22 DIAGNOSIS — T1490XA Injury, unspecified, initial encounter: Secondary | ICD-10-CM

## 2015-01-22 DIAGNOSIS — Y929 Unspecified place or not applicable: Secondary | ICD-10-CM | POA: Insufficient documentation

## 2015-01-22 DIAGNOSIS — Y93E5 Activity, floor mopping and cleaning: Secondary | ICD-10-CM | POA: Insufficient documentation

## 2015-01-22 DIAGNOSIS — I10 Essential (primary) hypertension: Secondary | ICD-10-CM | POA: Insufficient documentation

## 2015-01-22 DIAGNOSIS — W11XXXA Fall on and from ladder, initial encounter: Secondary | ICD-10-CM | POA: Insufficient documentation

## 2015-01-22 DIAGNOSIS — Z3202 Encounter for pregnancy test, result negative: Secondary | ICD-10-CM | POA: Insufficient documentation

## 2015-01-22 DIAGNOSIS — Y998 Other external cause status: Secondary | ICD-10-CM | POA: Insufficient documentation

## 2015-01-22 DIAGNOSIS — Z79899 Other long term (current) drug therapy: Secondary | ICD-10-CM | POA: Insufficient documentation

## 2015-01-22 HISTORY — DX: Type 2 diabetes mellitus without complications: E11.9

## 2015-01-22 HISTORY — DX: Essential (primary) hypertension: I10

## 2015-01-22 LAB — CBC WITH DIFFERENTIAL/PLATELET
BASOS PCT: 0 % (ref 0–1)
Basophils Absolute: 0 10*3/uL (ref 0.0–0.1)
EOS PCT: 1 % (ref 0–5)
Eosinophils Absolute: 0.1 10*3/uL (ref 0.0–0.7)
HEMATOCRIT: 37.4 % (ref 36.0–46.0)
Hemoglobin: 12.8 g/dL (ref 12.0–15.0)
LYMPHS ABS: 2.2 10*3/uL (ref 0.7–4.0)
Lymphocytes Relative: 25 % (ref 12–46)
MCH: 30.4 pg (ref 26.0–34.0)
MCHC: 34.2 g/dL (ref 30.0–36.0)
MCV: 88.8 fL (ref 78.0–100.0)
MONOS PCT: 4 % (ref 3–12)
Monocytes Absolute: 0.3 10*3/uL (ref 0.1–1.0)
NEUTROS PCT: 70 % (ref 43–77)
Neutro Abs: 6.3 10*3/uL (ref 1.7–7.7)
Platelets: 238 10*3/uL (ref 150–400)
RBC: 4.21 MIL/uL (ref 3.87–5.11)
RDW: 13 % (ref 11.5–15.5)
WBC: 8.9 10*3/uL (ref 4.0–10.5)

## 2015-01-22 LAB — COMPREHENSIVE METABOLIC PANEL
ALBUMIN: 3.8 g/dL (ref 3.5–5.0)
ALK PHOS: 66 U/L (ref 38–126)
ALT: 43 U/L (ref 14–54)
AST: 47 U/L — AB (ref 15–41)
Anion gap: 11 (ref 5–15)
BUN: 12 mg/dL (ref 6–20)
CO2: 23 mmol/L (ref 22–32)
Calcium: 9.2 mg/dL (ref 8.9–10.3)
Chloride: 102 mmol/L (ref 101–111)
Creatinine, Ser: 0.67 mg/dL (ref 0.44–1.00)
GFR calc Af Amer: >60 mL/min (ref >60)
GFR calc non Af Amer: >60 mL/min (ref >60)
GLUCOSE: 248 mg/dL — AB (ref 65–99)
POTASSIUM: 3.6 mmol/L (ref 3.5–5.1)
SODIUM: 136 mmol/L (ref 135–145)
Total Bilirubin: 0.5 mg/dL (ref 0.3–1.2)
Total Protein: 7.4 g/dL (ref 6.5–8.1)

## 2015-01-22 LAB — I-STAT BETA HCG BLOOD, ED (MC, WL, AP ONLY): I-stat hCG, quantitative: <5 m[IU]/mL (ref <5)

## 2015-01-22 MED ORDER — ONDANSETRON HCL 4 MG/2ML IJ SOLN
4.0000 mg | Freq: Once | INTRAMUSCULAR | Status: AC
  Administered 2015-01-22: 4 mg via INTRAVENOUS
  Filled 2015-01-22: qty 2

## 2015-01-22 MED ORDER — OXYCODONE-ACETAMINOPHEN 5-325 MG PO TABS: 2.0000 | ORAL_TABLET | ORAL | Status: DC | PRN

## 2015-01-22 MED ORDER — IOHEXOL 300 MG/ML  SOLN
100.0000 mL | Freq: Once | INTRAMUSCULAR | Status: AC | PRN
  Administered 2015-01-22: 100 mL via INTRAVENOUS

## 2015-01-22 MED ORDER — MORPHINE SULFATE 4 MG/ML IJ SOLN
4.0000 mg | Freq: Once | INTRAMUSCULAR | Status: AC
  Administered 2015-01-22: 4 mg via INTRAVENOUS
  Filled 2015-01-22: qty 1

## 2015-01-22 NOTE — ED Provider Notes (Signed)
MSE was initiated and I personally evaluated the patient and placed orders (if any) at  5:41 PM on January 22, 2015.  Patient to ED after fall from height of approximately 5 feet, off of a ladder, landing on right side. She denies hitting her head or syncope but felt faint after the fall. The fall occurred around 2:00 this afternoon and she states her pain continued to worsen. She has pain in the right chest causing difficulty breathing (SOB, pain with breathing) and abdominal pain, with onset nausea without vomiting.   She is uncomfortable appearing. Tender to palpation right chest and right abdomen. She will need work up for evaluation of chest/abdominal injury and is not appropriate for fast track. She will be moved to an acute care bed.  The patient appears stable so that the remainder of the MSE may be completed by another provider.  Elpidio Anis, PA-C 01/22/15 1744

## 2015-01-22 NOTE — ED Notes (Signed)
Pt reports fall from 5 feet off ladder, nausea, abdominal pain and tenderness, rib pain on right side. Pt denies CP, SOB.

## 2015-01-22 NOTE — ED Notes (Signed)
The patient was cleaning the crown molding and her ladder slipped and she fell 4 feet.  She denies LOC but says she landed on her back.  She rates her pain 10/10.

## 2015-01-22 NOTE — ED Provider Notes (Signed)
CSN: [id number]     Arrival date & time 01/22/15  1714 History   First MD Initiated Contact with Patient 01/22/15 1736     Chief Complaint  Patient presents with  . Fall    The patient was cleaning the crown molding and her ladder slipped and she fell 4 feet.  She denies LOC but says she landed on her back.      HPI Patient to ED after fall from height of approximately 5 feet, off of a ladder, landing on right side. She denies hitting her head or syncope but felt faint after the fall. The fall occurred around 2:00 this afternoon and she states her pain continued to worsen. She has pain in the right chest causing difficulty breathing (SOB, pain with breathing) and abdominal pain, with onset nausea without vomiting.  Past Medical History  Diagnosis Date  . Hypertension   . Diabetes mellitus without complication    History reviewed. No pertinent past surgical history. History reviewed. No pertinent family history. History  Substance Use Topics  . Smoking status: Never Smoker   . Smokeless tobacco: Never Used  . Alcohol Use: No   OB History    No data available     Review of Systems  Unable to perform ROS: Other      Allergies  Review of patient's allergies indicates not on file.  Home Medications   Prior to Admission medications   Medication Sig Start Date End Date Taking? Authorizing Provider  ibuprofen (ADVIL,MOTRIN) 200 MG tablet Take 200 mg by mouth every 6 (six) hours as needed for moderate pain.   Yes Historical Provider, MD  lisinopril (PRINIVIL,ZESTRIL) 5 MG tablet Take 2.5 mg by mouth daily.   Yes Historical Provider, MD  metFORMIN (GLUCOPHAGE) 500 MG tablet Take 500 mg by mouth 2 (two) times daily with a meal.   Yes Historical Provider, MD  oxyCODONE-acetaminophen (PERCOCET/ROXICET) 5-325 MG per tablet Take 2 tablets by mouth every 4 (four) hours as needed for severe pain. 01/22/15   Nelva Nay, MD   BP 115/74 mmHg  Pulse 56  Temp(Src) 98 F (36.7 C) (Oral)   Resp 20  SpO2 99%  LMP 01/17/2015 Physical Exam  Constitutional: She is oriented to person, place, and time. She appears well-developed and well-nourished. No distress.  HENT:  Head: Normocephalic and atraumatic.  Eyes: Pupils are equal, round, and reactive to light.  Neck: Normal range of motion.  Cardiovascular: Normal rate and intact distal pulses.   Pulmonary/Chest: No respiratory distress.    Abdominal: Normal appearance. She exhibits no distension.  Musculoskeletal: Normal range of motion.  Neurological: She is alert and oriented to person, place, and time. No cranial nerve deficit.  Skin: Skin is warm and dry. No rash noted.  Psychiatric: She has a normal mood and affect. Her behavior is normal.  Nursing note and vitals reviewed.   ED Course  Procedures (including critical care time) Labs Review Labs Reviewed  COMPREHENSIVE METABOLIC PANEL - Abnormal; Notable for the following:    Glucose, Bld 248 (*)    AST 47 (*)    All other components within normal limits  CBC WITH DIFFERENTIAL/PLATELET  I-STAT BETA HCG BLOOD, ED ([facility], [facility], [facility] ONLY)    Imaging Review No results found.   EKG Interpretation None      MDM   Final diagnoses:  Trauma  Rib fracture, right, closed, initial encounter        Nelva Nay, MD 01/25/15 2334

## 2015-01-22 NOTE — Discharge Instructions (Signed)
Kara Pacer de costilla  (Rib Fracture)  Una fractura de costilla es la ruptura de uno de los huesos que la forman. Las costillas son Otila Kluver jaula que rodea la parte superior del pecho. La fractura o fisura de una costilla puede ser dolorosa pero no causa otros problemas. La mayora de las fracturas de costillas se curan por s mismas en 1 a 3 meses. CUIDADOS EN EL HOGAR   Evite las actividades que puedan causar dolor en la zona lesionada. Proteja su zona lesionada.  Regrese a sus Statistician segn las indicaciones del mdico.  Tome los medicamentos como le indic el mdico.  Aplique hielo en la zona lesionada durante los primeros 1 a 2 das despus de haber recibido tratamiento o segn las indicaciones de su mdico.  Ponga el hielo en una bolsa plstica.  Colquese una toalla entre la piel y la bolsa de hielo.  Deje el hielo durante 15 a 20 minutos, cada 2 horas mientras se encuentre despierto.  Haga ejercicios de respiracin segn las indicaciones del mdico. Podrn indicarle que:  Haga respiraciones profundas varias veces al da.  Tosa varias veces al da mientras abraza una almohada.  Use un dispositivo (espirmetro de incentivo) para Education officer, environmental respiraciones profundas varias veces al da.  Beba gran cantidad de lquido para mantener el pis (orina) de tono claro o amarillo plido.   No use fajas ni sujetadores. No dejan que respire profundamente. SOLICITE AYUDA DE INMEDIATO SI:   Tiene fiebre.  Tiene dificultad para respirar.   No puede dejar de toser.  Tose y elimina saliva espesa (moco) con sangre.   Tiene Programme researcher, broadcasting/film/video (nuseas), devuelve (vomita), o tiene dolor de panza (abdominal).   El dolor empeora y los medicamentos no Contractor.  ASEGRESE DE QUE:   Comprende estas instrucciones.  Controlar su enfermedad.  Recibir ayuda de inmediato si no mejora o si empeora. Document Released: 04/01/2013 [facility] Patient Information 2015  [place], [place]. This information is not intended to replace advice given to you by your health care provider. Make sure you discuss any questions you have with your health care provider.

## 2015-01-27 ENCOUNTER — Encounter: Payer: Self-pay | Admitting: Internal Medicine

## 2015-01-27 ENCOUNTER — Ambulatory Visit (INDEPENDENT_AMBULATORY_CARE_PROVIDER_SITE_OTHER): Payer: No Typology Code available for payment source | Admitting: Internal Medicine

## 2015-01-27 VITALS — BP 108/69 | HR 68 | Temp 98.8°F | Resp 16 | Ht 66.0 in | Wt 179.0 lb

## 2015-01-27 DIAGNOSIS — S2231XS Fracture of one rib, right side, sequela: Secondary | ICD-10-CM

## 2015-01-27 MED ORDER — TRAMADOL HCL 50 MG PO TABS: 50.0000 mg | ORAL_TABLET | Freq: Three times a day (TID) | ORAL | Status: DC | PRN

## 2015-01-27 MED ORDER — IBUPROFEN 800 MG PO TABS
800.0000 mg | ORAL_TABLET | Freq: Three times a day (TID) | ORAL | Status: DC | PRN
Start: 2015-01-27 — End: 2016-12-01

## 2015-01-27 NOTE — Progress Notes (Signed)
Subjective:     Patient ID: Katrina [name], female   DOB: [birth year], 44 y.o.   MRN: [id number]  HPI: Pt was seen in the ED (under alias Katrina [name] NW#[id number]) after a mechanical fall off a ladder and sustaining a fracture of the 7th rib on the right side. She was treated with analgesics and sent home with a prescription for Percocet. Pt reports that she has been taking the percocet only sparingly as it is" too strong" and causing her to sleep too much. She has mostly been using Ibuprofen.   Today she rates the pain as 9/10 and states that she is unable to move her RUE much due to the pain.SHe has been icing the site of the fracture as advised in the ED.  The pain is worse with deep breaths and any movement of the RUE.    She is right-handed and is unable to move her RUE to perform her job which entails dexterity of the RUE. She has been excused from work until 01/31/2015.     Review of Systems  Constitutional: Negative.  Negative for fever, chills, activity change, appetite change and fatigue.  HENT: Negative for dental problem, ear pain, hearing loss, sore throat and trouble swallowing.   Eyes: Negative.  Negative for visual disturbance.  Respiratory: Negative.  Negative for cough, chest tightness, shortness of breath and wheezing.   Cardiovascular: Negative for chest pain, palpitations and leg swelling.  Gastrointestinal: Negative for abdominal pain, diarrhea, constipation, blood in stool, anal bleeding and rectal pain.  Endocrine: Negative for cold intolerance, heat intolerance, polydipsia, polyphagia and polyuria.  Genitourinary: Negative for dysuria, urgency, frequency, vaginal discharge, genital sores, vaginal pain, menstrual problem and dyspareunia.  Musculoskeletal: Positive for myalgias and arthralgias.  Allergic/Immunologic: Negative for environmental allergies.  Neurological: Negative for dizziness, tremors, weakness and headaches.  Hematological: Negative.   All other systems  reviewed and are negative.      Objective:   Physical Exam  Constitutional: She is oriented to person, place, and time. She appears well-developed and well-nourished.  HENT:  Head: Normocephalic and atraumatic.  Eyes: No scleral icterus.  Neck: Neck supple. No JVD present. No thyromegaly present.  Cardiovascular: Regular rhythm.  Exam reveals no gallop and no friction rub.   No murmur heard. Pulmonary/Chest: Effort normal and breath sounds normal. She has no wheezes. She has no rales.  Abdominal: Soft. Bowel sounds are normal. She exhibits no distension and no mass. There is no tenderness.  Musculoskeletal:  AROM in RUE decreased secondary to rib pain  Neurological: She is alert and oriented to person, place, and time. No cranial nerve deficit.  Skin: Skin is dry.  Vitals reviewed.      Assessment:    1. Right rib fracture, sequela - Pt still having significant pain. She has been taking Ibuprofen ATC and I am concerned about effects on kidney and BP. I have asked her to take the Tramadol during the day also as long as she is not sedated from it.  - Also advised alternating heat andcold compress. Incentive spirometer and AROM Ex's of the BUE's. - traMADol (ULTRAM) 50 MG tablet; Take 1 tablet (50 mg total) by mouth every 8 (eight) hours as needed.  Dispense: 30 tablet; Refill: 0 - ibuprofen (ADVIL,MOTRIN) 800 MG tablet; Take 1 tablet (800 mg total) by mouth every 8 (eight) hours as needed.  Dispense: 30 tablet; Refill: 0      Plan:     See above  RTC on Monday 12/01/2014

## 2015-01-28 ENCOUNTER — Encounter: Payer: Self-pay | Admitting: Internal Medicine

## 2015-01-31 ENCOUNTER — Ambulatory Visit (INDEPENDENT_AMBULATORY_CARE_PROVIDER_SITE_OTHER): Payer: No Typology Code available for payment source | Admitting: Family Medicine

## 2015-01-31 ENCOUNTER — Encounter: Payer: Self-pay | Admitting: Family Medicine

## 2015-01-31 VITALS — BP 114/70 | HR 65 | Temp 98.4°F | Resp 16 | Ht 66.0 in | Wt 182.0 lb

## 2015-01-31 DIAGNOSIS — S2231XS Fracture of one rib, right side, sequela: Secondary | ICD-10-CM

## 2015-01-31 DIAGNOSIS — Z789 Other specified health status: Secondary | ICD-10-CM

## 2015-01-31 MED ORDER — TRAMADOL HCL 50 MG PO TABS: 50.0000 mg | ORAL_TABLET | Freq: Three times a day (TID) | ORAL | Status: DC | PRN

## 2015-01-31 NOTE — Progress Notes (Signed)
Subjective:    Patient ID: Katrina [name], female    DOB: 03-26-1971, 44 y.o.   MRN: [id number]  HPI Patient presents for a 3 day follow up of a mechanical fall off of ladder and sustaining a fracture of the 7th rib, injury was primarily located on the right side. Patient was started on a trial Tramadol 50 mg every 6 hours as needed on 01/27/2015. She states that she only received 3 tablets. She states that she had moderate improvement on tramadol and continued Ibuprofen. Current pain intensity is 5/10 described as constant and aching. She last had Ibuprofen on 6/19 with minimal relief.   Past Medical History  Diagnosis Date  . Hypertension   . Diabetes mellitus without complication    History   Social History  . Marital Status: Unknown    Spouse Name: N/A  . Number of Children: N/A  . Years of Education: N/A   Occupational History  . Not on file.   Social History Main Topics  . Smoking status: Never Smoker   . Smokeless tobacco: Not on file  . Alcohol Use: No  . Drug Use: No  . Sexual Activity: Yes   Other Topics Concern  . Not on file   Social History Narrative   ** Merged History Encounter **        Review of Systems  Constitutional: Negative.   HENT: Negative.   Eyes: Negative.   Respiratory: Negative.   Cardiovascular: Negative.   Gastrointestinal: Negative.   Endocrine: Negative.   Genitourinary: Negative.   Musculoskeletal: Positive for myalgias (right rib pain with sudden movements) and arthralgias.  Skin: Negative.   Allergic/Immunologic: Negative.   Neurological: Negative.   Hematological: Negative.   Psychiatric/Behavioral: Negative.        Objective:   Physical Exam  Constitutional: She is oriented to person, place, and time. She appears well-developed and well-nourished.  HENT:  Head: Normocephalic and atraumatic.  Right Ear: External ear normal.  Left Ear: External ear normal.  Mouth/Throat: Oropharynx is clear and moist.  Eyes:  Conjunctivae and EOM are normal. Pupils are equal, round, and reactive to light.  Neck: Normal range of motion. Neck supple.  Cardiovascular: Normal rate, regular rhythm, normal heart sounds and intact distal pulses.   Pulmonary/Chest: Effort normal and breath sounds normal.  Abdominal: Soft. Bowel sounds are normal.  Musculoskeletal:  Patient is guarding right chest during PE, decreased ROM.   Neurological: She is alert and oriented to person, place, and time. She has normal reflexes.  Skin: Skin is warm and dry.  Psychiatric: She has a normal mood and affect. Her behavior is normal. Judgment and thought content normal.         BP 114/70 mmHg  Pulse 65  Temp(Src) 98.4 F (36.9 C) (Oral)  Resp 16  Ht  (1.676 m)  Wt 182 lb (82.555 kg)  BMI 29.39 kg/m2  LMP 01/20/2015 Assessment & Plan:   1. Right rib fracture, sequela Will continue Tramadol for moderate to severe pain. Also, continue Ibuprofen every 8 hours as needed for mild to moderate pain. Recommended that patient apply cold compresses for 20 minutes 4 times per day, use interchangeably with warm compresses 4 times per day as needed. Refrain from heavy lifting. Also, recommended abdominal binder when patient returns to work.   - traMADol (ULTRAM) 50 MG tablet; Take 1 tablet (50 mg total) by mouth every 8 (eight) hours as needed.  Dispense: 30 tablet; Refill: 0   Notified  [facility] pharmacy to verify prescription, patient only received 3 tablets according to prescription.copy and pharmacist.   Burnis Medin, Pharmacist   2. Language barrier to communication Patient primarily speaks spanish, utilized language line to assist with communication.    RTC: as previously scheduled for diabetes and hypertension  Massie Maroon, FNP

## 2015-01-31 NOTE — Patient Instructions (Signed)

## 2015-02-02 ENCOUNTER — Encounter: Payer: Self-pay | Admitting: Hematology

## 2015-02-07 ENCOUNTER — Ambulatory Visit (INDEPENDENT_AMBULATORY_CARE_PROVIDER_SITE_OTHER): Payer: No Typology Code available for payment source | Admitting: Family Medicine

## 2015-02-07 VITALS — BP 108/71 | HR 68 | Temp 98.1°F | Resp 16 | Ht 66.0 in | Wt 179.0 lb

## 2015-02-07 DIAGNOSIS — S2231XA Fracture of one rib, right side, initial encounter for closed fracture: Secondary | ICD-10-CM

## 2015-02-07 NOTE — Patient Instructions (Signed)
Avoid activities that might result in a fall or a blow to the right rib cage May continue Tramadol at night if needed. May continue OTC ibuprofen as need Heat to the area may help.

## 2015-02-07 NOTE — Progress Notes (Signed)
Subjective:     Patient ID: Katrina [name], female   DOB: 01/28/1971, 44 y.o.   MRN: [id number]  HPI   Patient presents today for a note to return to work. She experienced a non-displaced rib fracture of the 7th rib on the right. 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  She states there is little pain, occassional a 2/10. She denies other chest pain, Denies shortness of breath. She has a history of diabetes and hypertension and is due a follow-up visit in about a month.    Review of Systems   Negative for fever, chills. HENT; Negative Negative  for neck pain or stiffness Negative for chest pain other that residual soreness in the right lateral chest Negative for shortness of breath Negative for abd pain, n,v,d      Objective:   Physical Exam   Alert, oriented appropriate Skin is warm and dry Lungs are clear to auscultation Heart sounds are regular w/o m.g.r There is minor tenderness over the right lateral chest wall.    Assessment Plan     Rib fracture  -Continue Tramadol at night and ibuprofen during the day as needed - No lifting over 25 lbs - Try to avoid twisting at the waist. - Follow-up as needed for increase pain. -May return to work.  Diabetes- Last a1c 8.3 -Continue on current treatment and follow-up in about a month.  Hypertension well controlled. - Continue current treatment and follow-up in about a month.            Katrina HooverLinda C. Bernhardt, FNP-BC

## 2015-02-08 ENCOUNTER — Encounter: Payer: Self-pay | Admitting: Family Medicine

## 2015-02-08 DIAGNOSIS — S2239XA Fracture of one rib, unspecified side, initial encounter for closed fracture: Secondary | ICD-10-CM | POA: Insufficient documentation

## 2015-02-09 ENCOUNTER — Encounter: Payer: Self-pay | Admitting: Family Medicine

## 2015-03-03 ENCOUNTER — Ambulatory Visit: Payer: No Typology Code available for payment source | Admitting: Family Medicine

## 2015-05-26 ENCOUNTER — Ambulatory Visit (INDEPENDENT_AMBULATORY_CARE_PROVIDER_SITE_OTHER): Payer: Self-pay | Admitting: Family Medicine

## 2015-05-26 ENCOUNTER — Encounter: Payer: Self-pay | Admitting: Family Medicine

## 2015-05-26 VITALS — BP 124/77 | HR 69 | Temp 98.4°F | Ht 66.0 in | Wt 184.0 lb

## 2015-05-26 DIAGNOSIS — IMO0002 Reserved for concepts with insufficient information to code with codable children: Secondary | ICD-10-CM

## 2015-05-26 DIAGNOSIS — E1121 Type 2 diabetes mellitus with diabetic nephropathy: Secondary | ICD-10-CM

## 2015-05-26 DIAGNOSIS — E1165 Type 2 diabetes mellitus with hyperglycemia: Secondary | ICD-10-CM

## 2015-05-26 LAB — LIPID PANEL
CHOL/HDL RATIO: 6.3 ratio — AB (ref <=5.0)
CHOLESTEROL: 241 mg/dL — AB (ref 125–200)
HDL: 38 mg/dL — ABNORMAL LOW (ref >=46)
LDL Cholesterol: 125 mg/dL (ref <130)
TRIGLYCERIDES: 389 mg/dL — AB (ref <150)
VLDL: 78 mg/dL — ABNORMAL HIGH (ref <30)

## 2015-05-26 LAB — COMPLETE METABOLIC PANEL WITH GFR
ALBUMIN: 4.1 g/dL (ref 3.6–5.1)
ALT: 14 U/L (ref 6–29)
AST: 12 U/L (ref 10–30)
Alkaline Phosphatase: 84 U/L (ref 33–115)
BUN: 14 mg/dL (ref 7–25)
CALCIUM: 9.2 mg/dL (ref 8.6–10.2)
CO2: 26 mmol/L (ref 20–31)
Chloride: 101 mmol/L (ref 98–110)
Creat: 0.52 mg/dL (ref 0.50–1.10)
GFR, Est African American: >89 mL/min (ref >=60)
Glucose, Bld: 171 mg/dL — ABNORMAL HIGH (ref 65–99)
POTASSIUM: 3.9 mmol/L (ref 3.5–5.3)
SODIUM: 137 mmol/L (ref 135–146)
Total Bilirubin: 0.2 mg/dL (ref 0.2–1.2)
Total Protein: 7.3 g/dL (ref 6.1–8.1)

## 2015-05-26 MED ORDER — LISINOPRIL 5 MG PO TABS: 2.5000 mg | ORAL_TABLET | Freq: Every day | ORAL | Status: DC

## 2015-05-26 MED ORDER — PNEUMOCOCCAL 13-VAL CONJ VACC IM SUSP: 0.5000 mL | Freq: Once | INTRAMUSCULAR | Status: DC

## 2015-05-26 MED ORDER — PNEUMOCOCCAL 13-VAL CONJ VACC IM SUSP: 0.5000 mL | INTRAMUSCULAR | Status: DC

## 2015-05-26 MED ORDER — PNEUMOCOCCAL 13-VAL CONJ VACC IM SUSP
0.5000 mL | Freq: Once | INTRAMUSCULAR | Status: AC
  Administered 2015-05-26: 0.5 mL via INTRAMUSCULAR

## 2015-05-26 NOTE — Progress Notes (Signed)
Patient ID: Katrina [name], female   DOB: 06-18-1971, 44 y.o.   MRN: [id number]   Katrina [name], is a 44 y.o. female  WUJ:[id number]  [id number]  DOB - 28-Aug-1970  CC:  Chief Complaint  Patient presents with  . Follow-up    diabetes.  Not taking metformin or lisinopril due to ins running out,  hasn't taken in a month and a half.         HPI: Katrina [name] is a 44 y.o. female here for  follow-up of diabetes. She has been unable to afford her medications or a visit so has not had her metformin and lisinopril for about 6 weeks. She denies any particular complaints today.  She is in need of a mammogram, a  PAP, influenza and pneumococal vaccine and a urine micral. Her foot exam and eye exam is up to date. She does not follow-up a healthy diet and does not exercise regularly.  No Known Allergies Past Medical History  Diagnosis Date  . Hypertension   . Diabetes mellitus without complication [facility])    Current Outpatient Prescriptions on File Prior to Visit  Medication Sig Dispense Refill  . ibuprofen (ADVIL,MOTRIN) 200 MG tablet Take 200 mg by mouth every 6 (six) hours as needed for moderate pain.    Marland Kitchen ibuprofen (ADVIL,MOTRIN) 800 MG tablet Take 1 tablet (800 mg total) by mouth every 8 (eight) hours as needed. (Patient not taking: Reported on 05/26/2015) 30 tablet 0  . lisinopril (PRINIVIL,ZESTRIL) 5 MG tablet Take 2.5 mg by mouth daily.    . metFORMIN (GLUMETZA) 1000 MG (MOD) 24 hr tablet Take 1 tablet (1,000 mg total) by mouth daily with breakfast. (Patient not taking: Reported on 05/26/2015) 30 tablet 2  . traMADol (ULTRAM) 50 MG tablet Take 1 tablet (50 mg total) by mouth every 8 (eight) hours as needed. (Patient not taking: Reported on 05/26/2015) 30 tablet 0   No current facility-administered medications on file prior to visit.   Family History  Problem Relation Age of Onset  . Diabetes Mother   . Diabetes Father   . Diabetes Sister   . Diabetes Brother    Social History    Social History  . Marital Status: Unknown    Spouse Name: N/A  . Number of Children: N/A  . Years of Education: N/A   Occupational History  . Not on file.   Social History Main Topics  . Smoking status: Never Smoker   . Smokeless tobacco: Not on file  . Alcohol Use: No  . Drug Use: No  . Sexual Activity: Yes   Other Topics Concern  . Not on file   Social History Narrative   ** Merged History Encounter **        Review of Systems: Constitutional: Negative for weight change, appetite change, fatigue Skin: Negative for ulcerations. Eyes: Negative pain, visual disturbance Ears: Positive for some itching on the left. Respiratory: Negative for cough, shortness of breath,   Cardiovascular: Negative for chest pain, palpitations, pedal edema  Abdominal:Negative for nausea, vomiting, diarhea. Positive for constipation and heartburn. Genitourinary: Negative for frequency, urgency, polyuria Neurological: Negative for numbness, tingling,pain in feet. Positive for occassional headaches Psychiatric/Behavioral: Negative for depression, anxiety, confusion   Objective:   Filed Vitals:   05/26/15 1326  BP: 124/77  Pulse: 69  Temp: 98.4 F (36.9 C)    Physical Exam: Constitutional: Patient appears well-developed and well-nourished. No distress. HENT: Normocephalic, atraumatic. Oropharynx is clear and moist.  Eyes: Conjunctivae and EOM are  normal. PERRLA, no scleral icterus. Neck: Normal ROM. Neck supple. No lymphadenopathy, No thyromegaly. CVS: RRR, S1/S2 +, no murmurs, no gallops, no rubs Pulmonary: Effort and breath sounds normal, no stridor, rhonchi, wheezes, rales.  Abdominal: Soft. Normoactive BS,, no distension, tenderness, rebound or guarding.  Musculoskeletal: Normal range of motion. No edema and no tenderness.  Neuro: Alert.Normal muscle tone coordination. Non-focal Skin: Skin is warm and dry. No rash noted. Not diaphoretic. No erythema. No pallor. Psychiatric:  Normal mood and affect. Behavior, judgment, thought content normal.  Lab Results  Component Value Date   WBC 8.9 01/22/2015   HGB 12.8 01/22/2015   HCT 37.4 01/22/2015   MCV 88.8 01/22/2015   PLT 238 01/22/2015   Lab Results  Component Value Date   CREATININE 0.67 01/22/2015   BUN 12 01/22/2015   NA 136 01/22/2015   K 3.6 01/22/2015   CL 102 01/22/2015   CO2 23 01/22/2015    Lab Results  Component Value Date   HGBA1C 8.3* 12/07/2014   Lipid Panel     Component Value Date/Time   CHOL 238* 10/01/2014 1512   TRIG 434* 10/01/2014 1512   HDL 41 10/01/2014 1512   CHOLHDL 5.8 10/01/2014 1512   VLDL NOT CALC 10/01/2014 1512   LDLCALC NOT CALC 10/01/2014 1512       Assessment and plan:      1. Uncontrolled type 2 diabetes mellitus   - COMPLETE METABOLIC PANEL WITH GFR - CBC with Differential - Lipid panel - Hemoglobin A1c - Microalbumin, urine - Flu Vaccine QUAD 36+ mos PF IM (Fluarix & Fluzone Quad PF) -Prevnar today. -Discussed in detail the need to get her diabetes in better control to prevent complications such as limb loss, blindness, renal failure and heart disease. -Printed material provided.  3 month follow-up  The patient was given clear instructions to go to ER or return to medical center if symptoms don't improve, worsen or new problems develop. The patient verbalized understanding       05/26/2015, 2:09 PM

## 2015-05-26 NOTE — Patient Instructions (Addendum)
Come back in the near future (1-2 months for a PAP smear) You need to schedule an appointment for a mammogram. Cal [phone] You need to get back on your diabetes and blood pressure medications regularly. Take your metformin 1000 twice a day with breakfast and dinner. We are giving you a flu shot and a pneumonia shot today. Be very careful of carbs, particularly sweet foods in diet It is advised to be six small meals a day, rather than 3 large meals. Try to exercise regularly. [facility] to [facility] and get a V[facility] card and you can get your metformin free.

## 2015-05-27 LAB — CBC WITH DIFFERENTIAL/PLATELET
BASOS PCT: 0 % (ref 0–1)
Basophils Absolute: 0 10*3/uL (ref 0.0–0.1)
Eosinophils Absolute: 0.1 10*3/uL (ref 0.0–0.7)
Eosinophils Relative: 2 % (ref 0–5)
HCT: 39.7 % (ref 36.0–46.0)
HEMOGLOBIN: 13.4 g/dL (ref 12.0–15.0)
Lymphocytes Relative: 44 % (ref 12–46)
Lymphs Abs: 2.5 10*3/uL (ref 0.7–4.0)
MCH: 30.9 pg (ref 26.0–34.0)
MCHC: 33.8 g/dL (ref 30.0–36.0)
MCV: 91.5 fL (ref 78.0–100.0)
MPV: 11.1 fL (ref 8.6–12.4)
Monocytes Absolute: 0.3 10*3/uL (ref 0.1–1.0)
Monocytes Relative: 5 % (ref 3–12)
NEUTROS PCT: 49 % (ref 43–77)
Neutro Abs: 2.7 10*3/uL (ref 1.7–7.7)
Platelets: 261 10*3/uL (ref 150–400)
RBC: 4.34 MIL/uL (ref 3.87–5.11)
RDW: 13.4 % (ref 11.5–15.5)
WBC: 5.6 10*3/uL (ref 4.0–10.5)

## 2015-05-27 LAB — HEMOGLOBIN A1C
Hgb A1c MFr Bld: 7.7 % — ABNORMAL HIGH (ref <5.7)
Mean Plasma Glucose: 174 mg/dL — ABNORMAL HIGH (ref <117)

## 2015-05-27 LAB — MICROALBUMIN, URINE: MICROALB UR: 0.4 mg/dL (ref <2.0)

## 2015-05-30 ENCOUNTER — Other Ambulatory Visit: Payer: Self-pay | Admitting: Hematology

## 2015-05-30 MED ORDER — METFORMIN HCL ER (MOD) 1000 MG PO TB24: 1000.0000 mg | ORAL_TABLET | Freq: Every day | ORAL | Status: DC

## 2015-05-30 NOTE — Telephone Encounter (Signed)
Patient at pharmacy, and medication is not there.  LOV 05/26/15 with Katrina LivingLinda [name] / Metformin sent electronically to pharmacy

## 2015-07-26 ENCOUNTER — Other Ambulatory Visit: Payer: Self-pay | Admitting: Pharmacist

## 2015-07-26 MED ORDER — METFORMIN HCL ER 500 MG PO TB24: 1000.0000 mg | ORAL_TABLET | Freq: Every day | ORAL | Status: DC

## 2015-09-01 ENCOUNTER — Ambulatory Visit (INDEPENDENT_AMBULATORY_CARE_PROVIDER_SITE_OTHER): Payer: No Typology Code available for payment source | Admitting: Family Medicine

## 2015-09-01 ENCOUNTER — Encounter: Payer: Self-pay | Admitting: Family Medicine

## 2015-09-01 VITALS — BP 121/71 | HR 56 | Temp 98.0°F | Ht 66.5 in | Wt 183.0 lb

## 2015-09-01 DIAGNOSIS — E1110 Type 2 diabetes mellitus with ketoacidosis without coma: Secondary | ICD-10-CM

## 2015-09-01 DIAGNOSIS — E1310 Other specified diabetes mellitus with ketoacidosis without coma: Secondary | ICD-10-CM

## 2015-09-01 DIAGNOSIS — Z0000 Encounter for general adult medical examination without abnormal findings: Secondary | ICD-10-CM

## 2015-09-01 LAB — COMPLETE METABOLIC PANEL WITH GFR
ALT: 22 U/L (ref 6–29)
AST: 18 U/L (ref 10–30)
Albumin: 4 g/dL (ref 3.6–5.1)
Alkaline Phosphatase: 64 U/L (ref 33–115)
BUN: 9 mg/dL (ref 7–25)
CO2: 26 mmol/L (ref 20–31)
CREATININE: 0.55 mg/dL (ref 0.50–1.10)
Calcium: 9.6 mg/dL (ref 8.6–10.2)
Chloride: 105 mmol/L (ref 98–110)
GFR, Est African American: >89 mL/min (ref >=60)
GFR, Est Non African American: >89 mL/min (ref >=60)
GLUCOSE: 145 mg/dL — AB (ref 65–99)
Potassium: 4.2 mmol/L (ref 3.5–5.3)
SODIUM: 140 mmol/L (ref 135–146)
Total Bilirubin: 0.3 mg/dL (ref 0.2–1.2)
Total Protein: 7.2 g/dL (ref 6.1–8.1)

## 2015-09-01 LAB — CBC WITH DIFFERENTIAL/PLATELET
BASOS ABS: 0 10*3/uL (ref 0.0–0.1)
Basophils Relative: 0 % (ref 0–1)
EOS PCT: 3 % (ref 0–5)
Eosinophils Absolute: 0.2 10*3/uL (ref 0.0–0.7)
HEMATOCRIT: 38.5 % (ref 36.0–46.0)
Hemoglobin: 12.8 g/dL (ref 12.0–15.0)
LYMPHS ABS: 2.1 10*3/uL (ref 0.7–4.0)
LYMPHS PCT: 38 % (ref 12–46)
MCH: 30.5 pg (ref 26.0–34.0)
MCHC: 33.2 g/dL (ref 30.0–36.0)
MCV: 91.7 fL (ref 78.0–100.0)
MONO ABS: 0.2 10*3/uL (ref 0.1–1.0)
MONOS PCT: 4 % (ref 3–12)
MPV: 11.3 fL (ref 8.6–12.4)
Neutro Abs: 3 10*3/uL (ref 1.7–7.7)
Neutrophils Relative %: 55 % (ref 43–77)
Platelets: 316 10*3/uL (ref 150–400)
RBC: 4.2 MIL/uL (ref 3.87–5.11)
RDW: 13.9 % (ref 11.5–15.5)
WBC: 5.4 10*3/uL (ref 4.0–10.5)

## 2015-09-01 LAB — LIPID PANEL
CHOL/HDL RATIO: 5 ratio (ref <=5.0)
Cholesterol: 191 mg/dL (ref 125–200)
HDL: 38 mg/dL — AB (ref >=46)
LDL Cholesterol: 124 mg/dL (ref <130)
Triglycerides: 144 mg/dL (ref <150)
VLDL: 29 mg/dL (ref <30)

## 2015-09-01 MED ORDER — PRAVASTATIN SODIUM 40 MG PO TABS: 40.0000 mg | ORAL_TABLET | Freq: Every day | ORAL | Status: DC

## 2015-09-01 MED ORDER — LISINOPRIL 5 MG PO TABS: 2.5000 mg | ORAL_TABLET | Freq: Every day | ORAL | Status: DC

## 2015-09-01 NOTE — Patient Instructions (Signed)
I am going to wait until I get lab results to refill metformin. We may need to change dosage I am adding a medication for cholesterol (pravastatin). People with diabetes need to be on this medication even if their cholesterol is normal. Be careful of sweets and other carbs in your diet, as well as fats and cholesterol. Focus on eating vegetables and fruits and lean meat that is boiled, broiled, baked or grilled, not fried. Try to exercise regularly (walking is good).5 times a week.

## 2015-09-01 NOTE — Progress Notes (Signed)
Patient ID: Katrina [name], female   DOB: 1970/12/04, 45 y.o.   MRN: [id number]   Katrina [name], is a 45 y.o. female  [id number]  [id number]  DOB - 1971-03-01  CC:  Chief Complaint  Patient presents with  . Follow-up    diabetes, sometimes checks it at home, range is 150-170       HPI: Katrina [name] is a 45 y.o. female here for follow-up diabetes and hypertension. She reports taking her metformin regularly. She has recently run out of lisinopril. She is not taking statin as recommended for diabetics. She reports feeling well today and generally. She reports she sometimes checks her blood sugar at home and is 150-170. She is in need of a PAP and diabetic eye exam. She has had a flu shot this year, has had first pneumococcal vaccine and Tdap is up to date  No Known Allergies Past Medical History  Diagnosis Date  . Hypertension   . Diabetes mellitus without complication [facility])    Current Outpatient Prescriptions on File Prior to Visit  Medication Sig Dispense Refill  . ibuprofen (ADVIL,MOTRIN) 200 MG tablet Take 200 mg by mouth every 6 (six) hours as needed for moderate pain.    . metFORMIN (GLUCOPHAGE XR) 500 MG 24 hr tablet Take 2 tablets (1,000 mg total) by mouth daily with breakfast. 60 tablet 1  . ibuprofen (ADVIL,MOTRIN) 800 MG tablet Take 1 tablet (800 mg total) by mouth every 8 (eight) hours as needed. (Patient not taking: Reported on 05/26/2015) 30 tablet 0  . pneumococcal 13-valent conjugate vaccine (PREVNAR 13) SUSP injection Inject 0.5 mLs into the muscle once. 1 Syringe 0  . traMADol (ULTRAM) 50 MG tablet Take 1 tablet (50 mg total) by mouth every 8 (eight) hours as needed. (Patient not taking: Reported on 05/26/2015) 30 tablet 0   No current facility-administered medications on file prior to visit.   Family History  Problem Relation Age of Onset  . Diabetes Mother   . Diabetes Father   . Diabetes Sister   . Diabetes Brother    Social History   Social History   . Marital Status: Unknown    Spouse Name: N/A  . Number of Children: N/A  . Years of Education: N/A   Occupational History  . Not on file.   Social History Main Topics  . Smoking status: Never Smoker   . Smokeless tobacco: Not on file  . Alcohol Use: No  . Drug Use: No  . Sexual Activity: Yes   Other Topics Concern  . Not on file   Social History Narrative   ** Merged History Encounter **        Review of Systems: Constitutional: Negative for fever, chills, appetite change, weight loss,  Fatigue. Skin: Negative for rashes or lesions of concern. HENT: Negative for ear pain, ear discharge.nose bleeds Eyes: Negative for pain, discharge, redness, itching and visual disturbance. Neck: Negative for pain, stiffness Respiratory: Negative for cough, shortness of breath,   Cardiovascular: Negative for chest pain, palpitations and leg swelling. Gastrointestinal: Negative for abdominal pain, nausea, vomiting, diarrhea, constipations Genitourinary: Negative for dysuria, urgency, frequency, hematuria,  Musculoskeletal: Negative for back pain, joint pain, joint  swelling, and gait problem.Negative for weakness. Neurological: Negative for dizziness, tremors, seizures, syncope,   light-headedness, numbness and headaches.  Hematological: Negative for easy bruising or bleeding Psychiatric/Behavioral: Negative for depression, anxiety, decreased concentration, confusion   Objective:   Filed Vitals:   09/01/15 1052  BP: 121/71  Pulse: 56  Temp: 98 F (36.7 C)    Physical Exam: Constitutional: Patient appears well-developed and well-nourished. No distress. HENT: Normocephalic, atraumatic, External right and left ear normal. Oropharynx is clear and moist.  Eyes: Conjunctivae and EOM are normal. PERRLA, no scleral icterus. Neck: Normal ROM. Neck supple. No lymphadenopathy, No thyromegaly. CVS: RRR, S1/S2 +, no murmurs, no gallops, no rubs Pulmonary: Effort and breath sounds normal, no  stridor, rhonchi, wheezes, rales.  Abdominal: Soft. Normoactive BS,, no distension, tenderness, rebound or guarding.  Musculoskeletal: Normal range of motion. No edema and no tenderness.  Neuro: Alert.Normal muscle tone coordination. Non-focal Skin: Skin is warm and dry. No rash noted. Not diaphoretic. No erythema. No pallor. Psychiatric: Normal mood and affect. Behavior, judgment, thought content normal.  Lab Results  Component Value Date   WBC 5.6 05/26/2015   HGB 13.4 05/26/2015   HCT 39.7 05/26/2015   MCV 91.5 05/26/2015   PLT 261 05/26/2015   Lab Results  Component Value Date   CREATININE 0.52 05/26/2015   BUN 14 05/26/2015   NA 137 05/26/2015   K 3.9 05/26/2015   CL 101 05/26/2015   CO2 26 05/26/2015    Lab Results  Component Value Date   HGBA1C 7.7* 05/26/2015   Lipid Panel     Component Value Date/Time   CHOL 241* 05/26/2015 1420   TRIG 389* 05/26/2015 1420   HDL 38* 05/26/2015 1420   CHOLHDL 6.3* 05/26/2015 1420   VLDL 78* 05/26/2015 1420   LDLCALC 125 05/26/2015 1420       Assessment and plan:   1. Uncontrolled type 2 diabetes mellitus with ketoacidosis without coma, without long-term current use of insulin (HCC)  - COMPLETE METABOLIC PANEL WITH GFR - CBC w/Diff - Lipid panel - HIV antibody (with reflex) - pravastatin (PRAVACHOL) 40 MG tablet; Take 1 tablet (40 mg total) by mouth daily.  Dispense: 90 tablet; Refill: 1 - lisinopril (PRINIVIL,ZESTRIL) 5 MG tablet; Take 0.5 tablets (2.5 mg total) by mouth daily.  Dispense: 90 tablet; Refill: 1  2. Health care maintenance - HIV antibody (with reflex)  3. Need for PAP -Will address at next visit.      Return in about 3 months (around 11/30/2015) for HTN, Diabetes, A1C.  The patient was given clear instructions to go to ER or return to medical center if symptoms don't improve, worsen or new problems develop. The patient verbalized understanding.    Henrietta Hoover FNP  09/01/2015, 12:01 PM

## 2015-09-02 LAB — HIV ANTIBODY (ROUTINE TESTING W REFLEX): HIV: NONREACTIVE

## 2015-09-09 MED FILL — LISINOPRIL 5 MG TABLET: 5 | 30 days supply | Qty: 15 | Fill #2

## 2015-09-09 MED FILL — METFORMIN HCL ER 500 MG TAB: 500 | 30 days supply | Qty: 60 | Fill #1

## 2015-12-01 ENCOUNTER — Ambulatory Visit: Payer: Self-pay | Admitting: Family Medicine

## 2015-12-05 ENCOUNTER — Ambulatory Visit: Payer: Self-pay | Admitting: Family Medicine

## 2015-12-19 MED FILL — LISINOPRIL 5 MG TABLET: 5 | 30 days supply | Qty: 15 | Fill #3

## 2015-12-23 ENCOUNTER — Ambulatory Visit (INDEPENDENT_AMBULATORY_CARE_PROVIDER_SITE_OTHER): Payer: Self-pay | Admitting: Family Medicine

## 2015-12-23 ENCOUNTER — Other Ambulatory Visit: Payer: Self-pay | Admitting: Family Medicine

## 2015-12-23 ENCOUNTER — Encounter: Payer: Self-pay | Admitting: Family Medicine

## 2015-12-23 VITALS — BP 116/64 | HR 80 | Temp 98.4°F | Resp 18 | Ht 65.0 in | Wt 180.0 lb

## 2015-12-23 DIAGNOSIS — E1310 Other specified diabetes mellitus with ketoacidosis without coma: Secondary | ICD-10-CM

## 2015-12-23 DIAGNOSIS — Z1239 Encounter for other screening for malignant neoplasm of breast: Secondary | ICD-10-CM

## 2015-12-23 DIAGNOSIS — Z1231 Encounter for screening mammogram for malignant neoplasm of breast: Secondary | ICD-10-CM

## 2015-12-23 DIAGNOSIS — E1110 Type 2 diabetes mellitus with ketoacidosis without coma: Secondary | ICD-10-CM

## 2015-12-23 LAB — HEMOGLOBIN A1C
Hgb A1c MFr Bld: 8.1 % — ABNORMAL HIGH (ref <5.7)
Mean Plasma Glucose: 186 mg/dL

## 2015-12-23 LAB — GLUCOSE, CAPILLARY: GLUCOSE-CAPILLARY: 228 mg/dL — AB (ref 65–99)

## 2015-12-23 MED ORDER — METFORMIN HCL ER 500 MG PO TB24: 1000.0000 mg | ORAL_TABLET | Freq: Every day | ORAL | Status: DC

## 2015-12-23 MED ORDER — LISINOPRIL 5 MG PO TABS: 2.5000 mg | ORAL_TABLET | Freq: Every day | ORAL | Status: DC

## 2015-12-23 MED ORDER — PRAVASTATIN SODIUM 40 MG PO TABS: 40.0000 mg | ORAL_TABLET | Freq: Every day | ORAL | Status: DC

## 2015-12-23 MED FILL — ?PRAVASTATIN NA 40 MG TAB: 40 MG | 30 days supply | Qty: 30 | Fill #0

## 2015-12-23 MED FILL — METFORMIN HCL ER 500 MG TAB: 500 | 30 days supply | Qty: 60 | Fill #0

## 2015-12-23 NOTE — Progress Notes (Signed)
Patient ID: Katrina [name], female   DOB: 02/22/1971, 45 y.o.   MRN: [id number]   Katrina [name], is a [age] y.o. female  [id number]  [id number]  DOB - 04/09/1971  CC:  Chief Complaint  Patient presents with  . Follow-up    DM       HPI: Katrina [name] is a [age] y.o. female here for follow-up on diabetes. Her last A1C in late October was 7.7. She is on metformin 500 XL 2 daily. Pravastatin 40, and lisinopril 5 g. She did not take her medications today. She does report that she normally takes regularly. She is due a foot exam today as well as an A1C. She had a urine micral in October. She is in need of a mammogram and a diabetic eye exam. An HIV done previously was non reactive. She does need to return in the near future for a PAP smear.  No Known Allergies Past Medical History  Diagnosis Date  . Hypertension   . Diabetes mellitus without complication [facility](HCC)    Current Outpatient Prescriptions on File Prior to Visit  Medication Sig Dispense Refill  . ibuprofen (ADVIL,MOTRIN) 200 MG tablet Take 200 mg by mouth every 6 (six) hours as needed for moderate pain.    Marland Kitchen. ibuprofen (ADVIL,MOTRIN) 800 MG tablet Take 1 tablet (800 mg total) by mouth every 8 (eight) hours as needed. (Patient not taking: Reported on 05/26/2015) 30 tablet 0  . traMADol (ULTRAM) 50 MG tablet Take 1 tablet (50 mg total) by mouth every 8 (eight) hours as needed. (Patient not taking: Reported on 05/26/2015) 30 tablet 0   No current facility-administered medications on file prior to visit.   Family History  Problem Relation Age of Onset  . Diabetes Mother   . Diabetes Father   . Diabetes Sister   . Diabetes Brother    Social History   Social History  . Marital Status: Unknown    Spouse Name: N/A  . Number of Children: N/A  . Years of Education: N/A   Occupational History  . Not on file.   Social History Main Topics  . Smoking status: Never Smoker   . Smokeless tobacco: Not on file  . Alcohol Use: No  .  Drug Use: No  . Sexual Activity: Yes    Birth Control/ Protection: Condom   Other Topics Concern  . Not on file   Social History Narrative   ** Merged History Encounter **        Review of Systems: Constitutional: Negative for fever, chills, appetite change, weight loss,  Fatigue. Skin: Negative for rashes or lesions of concern. HENT: Negative for ear pain, ear discharge.nose bleeds Eyes: Negative for pain, discharge, redness, itching. Positive for occasional blurred vision.  Neck: Negative for pain, stiffness Respiratory: Negative for cough, shortness of breath,   Cardiovascular: Negative for chest pain, palpitations and leg swelling. Gastrointestinal: Negative for abdominal pain, nausea, vomiting, diarrhea, constipations Genitourinary: Negative for dysuria, urgency, frequency, hematuria,  Musculoskeletal: Negative for back pain, joint pain, joint  swelling, and gait problem.Negative for weakness. Neurological: Positive for dizziness, tremors, seizures, syncope,   light-headedness, numbness and headaches.  Hematological: Negative for easy bruising or bleeding Psychiatric/Behavioral: Negative for depression, anxiety, decreased concentration, confusion   Objective:   Filed Vitals:   12/23/15 1323  BP: 116/64  Pulse: 80  Temp: 98.4 F (36.9 C)  Resp: 18    Physical Exam: Constitutional: Patient appears well-developed and well-nourished. No distress. HENT: Normocephalic, atraumatic, External right and  left ear normal. Oropharynx is clear and moist.  Eyes: Conjunctivae and EOM are normal. PERRLA, no scleral icterus. Neck: Normal ROM. Neck supple. No lymphadenopathy, No thyromegaly. CVS: RRR, S1/S2 +, no murmurs, no gallops, no rubs Pulmonary: Effort and breath sounds normal, no stridor, rhonchi, wheezes, rales.  Abdominal: Soft. Normoactive BS,, no distension, tenderness, rebound or guarding.  Musculoskeletal: Normal range of motion. No edema and no tenderness.  Neuro:  Alert.Normal muscle tone coordination. Non-focal Skin: Skin is warm and dry. No rash noted. Not diaphoretic. No erythema. No pallor. Psychiatric: Normal mood and affect. Behavior, judgment, thought content normal. Foot exam:  Monofilament normal, nail fungus present.  Lab Results  Component Value Date   WBC 5.4 09/01/2015   HGB 12.8 09/01/2015   HCT 38.5 09/01/2015   MCV 91.7 09/01/2015   PLT 316 09/01/2015   Lab Results  Component Value Date   CREATININE 0.55 09/01/2015   BUN 9 09/01/2015   NA 140 09/01/2015   K 4.2 09/01/2015   CL 105 09/01/2015   CO2 26 09/01/2015    Lab Results  Component Value Date   HGBA1C 8.1* 12/23/2015   Lipid Panel     Component Value Date/Time   CHOL 191 09/01/2015 1132   TRIG 144 09/01/2015 1132   HDL 38* 09/01/2015 1132   CHOLHDL 5.0 09/01/2015 1132   VLDL 29 09/01/2015 1132   LDLCALC 124 09/01/2015 1132       Assessment and plan:   1. Uncontrolled type 2 diabetes mellitus with ketoacidosis without coma, without long-term current use of insulin (HCC)  - Glucose, capillary - Hemoglobin A1c - metFORMIN (GLUCOPHAGE XR) 500 MG 24 hr tablet; Take 2 tablets (1,000 mg total) by mouth daily with breakfast.  Dispense: 60 tablet; Refill: 1 - lisinopril (PRINIVIL,ZESTRIL) 5 MG tablet; Take 0.5 tablets (2.5 mg total) by mouth daily.  Dispense: 90 tablet; Refill: 1 - pravastatin (PRAVACHOL) 40 MG tablet; Take 1 tablet (40 mg total) by mouth daily.  Dispense: 90 tablet; Refill: 1 - Ambulatory referral to Ophthalmology  2. Breast cancer screening  - MM DIGITAL SCREENING BILATERAL; Future   Return in about 3 months (around 03/24/2016) for Diabetes, A1C.  The patient was given clear instructions to go to ER or return to medical center if symptoms don't improve, worsen or new problems develop. The patient verbalized understanding.    Henrietta Hoover FNP  12/26/2015, 7:40 AM

## 2015-12-23 NOTE — Progress Notes (Signed)
Patient is here for HTN and DM FU  Patient denies pain at this time.  Patient has not taken any medication today. Patient has eaten today.  Patient declines a PT for missed period this month.

## 2015-12-26 ENCOUNTER — Other Ambulatory Visit: Payer: Self-pay | Admitting: Family Medicine

## 2015-12-26 MED ORDER — GLIPIZIDE 5 MG PO TABS: 2.5000 mg | ORAL_TABLET | Freq: Every day | ORAL | Status: DC

## 2015-12-26 MED FILL — glipiZIDE 5 MG TABS: 5 | 30 days supply | Qty: 15 | Fill #0

## 2016-01-06 ENCOUNTER — Ambulatory Visit: Payer: Self-pay

## 2016-01-24 ENCOUNTER — Ambulatory Visit
Admission: RE | Admit: 2016-01-24 | Discharge: 2016-01-24 | Disposition: A | Discharge status: Home / Self Care | Payer: No Typology Code available for payment source | Source: Ambulatory Visit | Attending: Family Medicine | Admitting: Family Medicine

## 2016-01-24 DIAGNOSIS — Z1239 Encounter for other screening for malignant neoplasm of breast: Secondary | ICD-10-CM

## 2016-03-30 ENCOUNTER — Ambulatory Visit: Payer: Self-pay | Admitting: Family Medicine

## 2016-04-26 MED FILL — ?LISINOPRIL 5 MG TABLET: 5 | 30 days supply | Qty: 15 | Fill #4

## 2016-04-26 MED FILL — METFORMIN HCL ER 500 MG TAB: 500 | 30 days supply | Qty: 60 | Fill #1

## 2016-04-26 MED FILL — ?PRAVASTATIN NA 40 MG TAB: 40 MG | 30 days supply | Qty: 30 | Fill #1

## 2016-05-17 ENCOUNTER — Encounter: Payer: Self-pay | Admitting: Family Medicine

## 2016-05-17 ENCOUNTER — Ambulatory Visit (INDEPENDENT_AMBULATORY_CARE_PROVIDER_SITE_OTHER): Payer: No Typology Code available for payment source | Admitting: Family Medicine

## 2016-05-17 VITALS — BP 122/66 | HR 76 | Temp 98.1°F | Resp 16 | Ht 65.0 in | Wt 184.0 lb

## 2016-05-17 DIAGNOSIS — Z0000 Encounter for general adult medical examination without abnormal findings: Secondary | ICD-10-CM

## 2016-05-17 DIAGNOSIS — IMO0001 Reserved for inherently not codable concepts without codable children: Secondary | ICD-10-CM

## 2016-05-17 DIAGNOSIS — E1165 Type 2 diabetes mellitus with hyperglycemia: Secondary | ICD-10-CM

## 2016-05-17 LAB — COMPLETE METABOLIC PANEL WITH GFR
ALBUMIN: 4 g/dL (ref 3.6–5.1)
ALK PHOS: 87 U/L (ref 33–115)
ALT: 15 U/L (ref 6–29)
AST: 12 U/L (ref 10–30)
BILIRUBIN TOTAL: 0.2 mg/dL (ref 0.2–1.2)
BUN: 16 mg/dL (ref 7–25)
CALCIUM: 9.3 mg/dL (ref 8.6–10.2)
CO2: 24 mmol/L (ref 20–31)
Chloride: 102 mmol/L (ref 98–110)
Creat: 0.66 mg/dL (ref 0.50–1.10)
Glucose, Bld: 139 mg/dL — ABNORMAL HIGH (ref 65–99)
Potassium: 3.7 mmol/L (ref 3.5–5.3)
Sodium: 136 mmol/L (ref 135–146)
TOTAL PROTEIN: 7.1 g/dL (ref 6.1–8.1)

## 2016-05-17 LAB — LIPID PANEL
CHOLESTEROL: 227 mg/dL — AB (ref 125–200)
HDL: 39 mg/dL — AB (ref >=46)
LDL Cholesterol: 133 mg/dL — ABNORMAL HIGH (ref <130)
TRIGLYCERIDES: 277 mg/dL — AB (ref <150)
Total CHOL/HDL Ratio: 5.8 Ratio — ABNORMAL HIGH (ref <=5.0)
VLDL: 55 mg/dL — ABNORMAL HIGH (ref <30)

## 2016-05-17 LAB — CBC WITH DIFFERENTIAL/PLATELET
BASOS ABS: 0 {cells}/uL (ref 0–200)
Basophils Relative: 0 %
EOS PCT: 2 %
Eosinophils Absolute: 120 cells/uL (ref 15–500)
HCT: 39.8 % (ref 35.0–45.0)
HEMOGLOBIN: 13.2 g/dL (ref 11.7–15.5)
LYMPHS ABS: 2760 {cells}/uL (ref 850–3900)
Lymphocytes Relative: 46 %
MCH: 29.7 pg (ref 27.0–33.0)
MCHC: 33.2 g/dL (ref 32.0–36.0)
MCV: 89.6 fL (ref 80.0–100.0)
MONOS PCT: 6 %
MPV: 11.8 fL (ref 7.5–12.5)
Monocytes Absolute: 360 cells/uL (ref 200–950)
NEUTROS ABS: 2760 {cells}/uL (ref 1500–7800)
NEUTROS PCT: 46 %
Platelets: 245 10*3/uL (ref 140–400)
RBC: 4.44 MIL/uL (ref 3.80–5.10)
RDW: 13.7 % (ref 11.0–15.0)
WBC: 6 10*3/uL (ref 3.8–10.8)

## 2016-05-17 LAB — POCT GLYCOSYLATED HEMOGLOBIN (HGB A1C): HEMOGLOBIN A1C: 8.5

## 2016-05-17 MED ORDER — METFORMIN HCL 500 MG PO TABS: 500.0000 mg | ORAL_TABLET | Freq: Two times a day (BID) | ORAL | 1 refills | Status: DC

## 2016-05-17 MED ORDER — PNEUMOCOCCAL VAC POLYVALENT 25 MCG/0.5ML IJ INJ
0.5000 mL | INJECTION | INTRAMUSCULAR | Status: AC | PRN
  Administered 2016-05-17: 0.5 mL via INTRAMUSCULAR

## 2016-05-17 NOTE — Progress Notes (Signed)
Katrina [name], is a 45 y.o. female  [id number]  [id number]  DOB - 09/08/1970  CC:  Chief Complaint  Patient presents with  . Diabetes       HPI: Katrina LaurenceBlanca Manalang is a 45 y.o. female here for follow-up diabetes, hypertension and hyperlipidemia. She has been prescribed glucophage XR 500 and glipizide 2.5 mg daily. She is not taking the glipizide. Her A1C in May was 8.1, is not 8.5. She is also on pravastatin 40 and lisinopril 2.5 mg daily. Her BP today is 122/66.   Health Maintenance: Accepts a flu shot and pneumococcal 23 today. She is uptodate on her PAP and mammogram. She reports trying to follow a diabetic diet, does not exercise regularly. Walks a lot a work.  No Known Allergies Past Medical History:  Diagnosis Date  . Diabetes mellitus without complication (HCC)   . Hypertension    Current Outpatient Prescriptions on File Prior to Visit  Medication Sig Dispense Refill  . ibuprofen (ADVIL,MOTRIN) 200 MG tablet Take 200 mg by mouth every 6 (six) hours as needed for moderate pain.    Marland Kitchen. lisinopril (PRINIVIL,ZESTRIL) 5 MG tablet Take 0.5 tablets (2.5 mg total) by mouth daily. 90 tablet 1  . pravastatin (PRAVACHOL) 40 MG tablet Take 1 tablet (40 mg total) by mouth daily. 90 tablet 1  . ibuprofen (ADVIL,MOTRIN) 800 MG tablet Take 1 tablet (800 mg total) by mouth every 8 (eight) hours as needed. (Patient not taking: Reported on 05/17/2016) 30 tablet 0  . traMADol (ULTRAM) 50 MG tablet Take 1 tablet (50 mg total) by mouth every 8 (eight) hours as needed. (Patient not taking: Reported on 05/17/2016) 30 tablet 0   No current facility-administered medications on file prior to visit.    Family History  Problem Relation Age of Onset  . Diabetes Mother   . Diabetes Father   . Diabetes Sister   . Diabetes Brother    Social History   Social History  . Marital status: Married    Spouse name: N/A  . Number of children: N/A  . Years of education: N/A   Occupational History  .  Not on file.   Social History Main Topics  . Smoking status: Never Smoker  . Smokeless tobacco: Never Used  . Alcohol use No  . Drug use: No  . Sexual activity: Yes    Birth control/ protection: Condom   Other Topics Concern  . Not on file   Social History Narrative   ** Merged History Encounter **        Review of Systems: Constitutional: Negative Skin: Negative HENT: Negative  Eyes: Negative  Neck: Negative Respiratory: Negative Cardiovascular: Negative Gastrointestinal: Negative Genitourinary: Negative  Musculoskeletal: Negative   Neurological: Negative  Hematological: Negative  Psychiatric/Behavioral: Negative    Objective:   Vitals:   05/17/16 1332  BP: 122/66  Pulse: 76  Resp: 16  Temp: 98.1 F (36.7 C)    Physical Exam: Constitutional: Patient appears well-developed and well-nourished. No distress. HENT: Normocephalic, atraumatic, External right and left ear normal. Oropharynx is clear and moist.  Eyes: Conjunctivae and EOM are normal. PERRLA, no scleral icterus. Neck: Normal ROM. Neck supple. No lymphadenopathy, No thyromegaly. CVS: RRR, S1/S2 +, no murmurs, no gallops, no rubs Pulmonary: Effort and breath sounds normal, no stridor, rhonchi, wheezes, rales.  Abdominal: Soft. Normoactive BS,, no distension, tenderness, rebound or guarding.  Musculoskeletal: Normal range of motion. No edema and no tenderness.  Neuro: Alert.Normal muscle tone coordination. Non-focal Skin: Skin  is warm and dry. No rash noted. Not diaphoretic. No erythema. No pallor. Psychiatric: Normal mood and affect. Behavior, judgment, thought content normal.  Lab Results  Component Value Date   WBC 5.4 09/01/2015   HGB 12.8 09/01/2015   HCT 38.5 09/01/2015   MCV 91.7 09/01/2015   PLT 316 09/01/2015   Lab Results  Component Value Date   CREATININE 0.55 09/01/2015   BUN 9 09/01/2015   NA 140 09/01/2015   K 4.2 09/01/2015   CL 105 09/01/2015   CO2 26 09/01/2015    Lab  Results  Component Value Date   HGBA1C 8.5 05/17/2016   Lipid Panel     Component Value Date/Time   CHOL 191 09/01/2015 1132   TRIG 144 09/01/2015 1132   HDL 38 (L) 09/01/2015 1132   CHOLHDL 5.0 09/01/2015 1132   VLDL 29 09/01/2015 1132   LDLCALC 124 09/01/2015 1132       Assessment and plan:   1. Uncontrolled type 2 diabetes mellitus without complication, without long-term current use of insulin (HCC)  Am increasing her metformin to 500 mg twice a day and stopping glipizide.  - POCT glycosylated hemoglobin (Hb A1C) - CBC with Differential - COMPLETE METABOLIC PANEL WITH GFR - Lipid panel - Microalbumin, urine - Ambulatory referral to Ophthalmology - metFORMIN (GLUCOPHAGE) 500 MG tablet; Take 1 tablet (500 mg total) by mouth 2 (two) times daily with a meal.  Dispense: 180 tablet; Refill: 1  2. Healthcare maintenance  - Flu Vaccine QUAD 36+ mos PF IM (Fluarix & Fluzone Quad PF) - pneumococcal 23 valent vaccine (PNU-IMMUNE) injection 0.5 mL; Inject 0.5 mLs into the muscle Prior to discharge for immunization.   Return in about 3 months (around 08/17/2016) for A1C.  The patient was given clear instructions to go to ER or return to medical center if symptoms don't improve, worsen or new problems develop. The patient verbalized understanding.    Henrietta Hoover FNP  05/17/2016, 3:10 PM

## 2016-05-17 NOTE — Patient Instructions (Signed)
Stop glipizide Start taking metformin 2 times a day with breakfast and dinner (about 12 hours apart)

## 2016-05-19 LAB — MICROALBUMIN, URINE: MICROALB UR: 0.3 mg/dL

## 2016-06-18 ENCOUNTER — Other Ambulatory Visit: Payer: Self-pay

## 2016-06-18 DIAGNOSIS — E1165 Type 2 diabetes mellitus with hyperglycemia: Secondary | ICD-10-CM

## 2016-06-18 DIAGNOSIS — E1110 Type 2 diabetes mellitus with ketoacidosis without coma: Secondary | ICD-10-CM

## 2016-06-18 DIAGNOSIS — IMO0001 Reserved for inherently not codable concepts without codable children: Secondary | ICD-10-CM

## 2016-06-18 MED ORDER — LISINOPRIL 5 MG PO TABS
2.5000 mg | ORAL_TABLET | Freq: Every day | ORAL | 1 refills | Status: DC
Start: 2016-06-18 — End: 2016-11-13

## 2016-06-18 MED ORDER — PRAVASTATIN SODIUM 40 MG PO TABS: 40.0000 mg | ORAL_TABLET | Freq: Every day | ORAL | 1 refills | Status: DC

## 2016-06-18 MED ORDER — METFORMIN HCL 500 MG PO TABS: 500.0000 mg | ORAL_TABLET | Freq: Two times a day (BID) | ORAL | 1 refills | Status: DC

## 2016-06-18 MED FILL — LISINOPRIL 5 MG TABLET: 5 | 30 days supply | Qty: 15 | Fill #0

## 2016-06-18 MED FILL — ?PRAVASTATIN NA 40 MG TAB: 40 MG | 30 days supply | Qty: 30 | Fill #0

## 2016-06-18 MED FILL — ?METFORMIN HCL 500MG TABLET: 500 | 30 days supply | Qty: 60 | Fill #0

## 2016-06-18 NOTE — Telephone Encounter (Signed)
Refills for lisinopril, metformin, and pravastatin sent into pharmacy. Thanks!

## 2016-08-02 MED FILL — LISINOPRIL 5 MG TABLET: 5 | 30 days supply | Qty: 15 | Fill #1

## 2016-08-02 MED FILL — PRAVASTATIN NA 40 MG TAB: 40 | 30 days supply | Qty: 30 | Fill #1

## 2016-08-02 MED FILL — ?METFORMIN HCL 500MG TABLET: 500 | 30 days supply | Qty: 60 | Fill #1

## 2016-08-20 ENCOUNTER — Ambulatory Visit: Payer: Self-pay | Admitting: Family Medicine

## 2016-10-31 MED FILL — LISINOPRIL 5 MG TAB: 5 | 30 days supply | Qty: 15 | Fill #2

## 2016-10-31 MED FILL — ?METFORMIN HCL 500MG TABLET: 500 | 30 days supply | Qty: 60 | Fill #2

## 2016-10-31 MED FILL — PRAVASTATIN NA 40 MG TAB: 40 | 30 days supply | Qty: 30 | Fill #2

## 2016-11-13 ENCOUNTER — Ambulatory Visit (INDEPENDENT_AMBULATORY_CARE_PROVIDER_SITE_OTHER): Payer: Self-pay | Admitting: Family Medicine

## 2016-11-13 ENCOUNTER — Encounter: Payer: Self-pay | Admitting: Family Medicine

## 2016-11-13 VITALS — BP 104/78 | HR 71 | Temp 98.4°F | Ht 65.0 in | Wt 174.0 lb

## 2016-11-13 DIAGNOSIS — R739 Hyperglycemia, unspecified: Secondary | ICD-10-CM

## 2016-11-13 DIAGNOSIS — E785 Hyperlipidemia, unspecified: Secondary | ICD-10-CM

## 2016-11-13 DIAGNOSIS — Z789 Other specified health status: Secondary | ICD-10-CM

## 2016-11-13 DIAGNOSIS — I10 Essential (primary) hypertension: Secondary | ICD-10-CM

## 2016-11-13 DIAGNOSIS — IMO0001 Reserved for inherently not codable concepts without codable children: Secondary | ICD-10-CM

## 2016-11-13 DIAGNOSIS — E1165 Type 2 diabetes mellitus with hyperglycemia: Secondary | ICD-10-CM

## 2016-11-13 LAB — COMPREHENSIVE METABOLIC PANEL
ALK PHOS: 85 U/L (ref 33–115)
ALT: 16 U/L (ref 6–29)
AST: 12 U/L (ref 10–35)
Albumin: 4 g/dL (ref 3.6–5.1)
BUN: 13 mg/dL (ref 7–25)
CALCIUM: 9.6 mg/dL (ref 8.6–10.2)
CHLORIDE: 102 mmol/L (ref 98–110)
CO2: 21 mmol/L (ref 20–31)
Creat: 0.59 mg/dL (ref 0.50–1.10)
Glucose, Bld: 114 mg/dL — ABNORMAL HIGH (ref 65–99)
Potassium: 4 mmol/L (ref 3.5–5.3)
Sodium: 138 mmol/L (ref 135–146)
Total Bilirubin: 0.2 mg/dL (ref 0.2–1.2)
Total Protein: 7.5 g/dL (ref 6.1–8.1)

## 2016-11-13 LAB — CBC WITH DIFFERENTIAL/PLATELET
BASOS PCT: 0 %
Basophils Absolute: 0 cells/uL (ref 0–200)
EOS PCT: 2 %
Eosinophils Absolute: 116 cells/uL (ref 15–500)
HCT: 42.7 % (ref 35.0–45.0)
HEMOGLOBIN: 14.1 g/dL (ref 11.7–15.5)
LYMPHS ABS: 2842 {cells}/uL (ref 850–3900)
Lymphocytes Relative: 49 %
MCH: 30 pg (ref 27.0–33.0)
MCHC: 33 g/dL (ref 32.0–36.0)
MCV: 90.9 fL (ref 80.0–100.0)
MPV: 11.4 fL (ref 7.5–12.5)
Monocytes Absolute: 348 cells/uL (ref 200–950)
Monocytes Relative: 6 %
NEUTROS ABS: 2494 {cells}/uL (ref 1500–7800)
Neutrophils Relative %: 43 %
Platelets: 238 10*3/uL (ref 140–400)
RBC: 4.7 MIL/uL (ref 3.80–5.10)
RDW: 13.5 % (ref 11.0–15.0)
WBC: 5.8 10*3/uL (ref 3.8–10.8)

## 2016-11-13 LAB — LIPID PANEL
CHOLESTEROL: 202 mg/dL — AB (ref <200)
HDL: 37 mg/dL — ABNORMAL LOW (ref >50)
LDL Cholesterol: 123 mg/dL — ABNORMAL HIGH (ref <100)
Total CHOL/HDL Ratio: 5.5 Ratio — ABNORMAL HIGH (ref <5.0)
Triglycerides: 212 mg/dL — ABNORMAL HIGH (ref <150)
VLDL: 42 mg/dL — AB (ref <30)

## 2016-11-13 LAB — POCT GLYCOSYLATED HEMOGLOBIN (HGB A1C): Hemoglobin A1C: 8.7

## 2016-11-13 LAB — GLUCOSE, CAPILLARY: GLUCOSE-CAPILLARY: 138 mg/dL — AB (ref 65–99)

## 2016-11-13 MED ORDER — LISINOPRIL 5 MG PO TABS: 2.5000 mg | ORAL_TABLET | Freq: Every day | ORAL | 1 refills | Status: DC

## 2016-11-13 MED ORDER — PRAVASTATIN SODIUM 40 MG PO TABS: 40.0000 mg | ORAL_TABLET | Freq: Every day | ORAL | 1 refills | Status: DC

## 2016-11-13 MED ORDER — METFORMIN HCL 500 MG PO TABS: 1000.0000 mg | ORAL_TABLET | Freq: Two times a day (BID) | ORAL | 1 refills | Status: DC

## 2016-11-13 MED ORDER — SAXAGLIPTIN HCL 5 MG PO TABS: 5.0000 mg | ORAL_TABLET | Freq: Every day | ORAL | 0 refills | Status: DC

## 2016-11-13 MED FILL — metFORMIN HCL 500 MG TABS: 500 | 30 days supply | Qty: 120 | Fill #0

## 2016-11-13 MED FILL — ONGLYZA 5 MG TABLET: 5 | 30 days supply | Qty: 30 | Fill #0

## 2016-11-13 NOTE — Progress Notes (Signed)
Patient ID: Katrina [name], female    DOB: Dec 25, 1970, 46 y.o.   MRN: [id number]  PCP: Katrina Courts, FNP  Chief Complaint  Patient presents with  . Blood Sugar Problem    WAS 297 THIS MORNING AFTER MEDS    Subjective:  HPI interperter Katrina [name] [id number] Katrina [name] is a 47 y.o. female presents for evaluation of elevated home glucose readings.  Katrina [name] reports only recently monitoring home glucose readings due to persistent headaches and noting increase frequency of urination and thirst. Since she started monitoring blood sugars at home she reports reading over the last  1.5 week as high as low 300. Denies numbness or tingling of fingers or feet. Hazely reports no recent vision changes. Reports compliance with Metformin 500 mg twice daily.  Social History   Social History  . Marital status: Married    Spouse name: N/A  . Number of children: N/A  . Years of education: N/A   Occupational History  . Not on file.   Social History Main Topics  . Smoking status: Never Smoker  . Smokeless tobacco: Never Used  . Alcohol use No  . Drug use: No  . Sexual activity: Yes    Birth control/ protection: Condom   Other Topics Concern  . Not on file   Social History Narrative   ** Merged History Encounter **        Family History  Problem Relation Age of Onset  . Diabetes Mother   . Diabetes Father   . Diabetes Sister   . Diabetes Brother    Review of Systems See HPI   Patient Active Problem List   Diagnosis Date Noted  . Dyslipidemia 10/04/2014  . Diabetes mellitus type 2, uncontrolled (HCC) 10/04/2014  . Hyperglycemia 10/01/2014  . Other fatigue 10/01/2014  . Peri-menopause 10/01/2014  . Language barrier to communication 10/01/2014    No Known Allergies  Prior to Admission medications   Medication Sig Start Date End Date Taking? Authorizing Provider  ibuprofen (ADVIL,MOTRIN) 200 MG tablet Take 200 mg by mouth every 6 (six) hours as needed for moderate pain.   Yes  Historical Provider, MD  ibuprofen (ADVIL,MOTRIN) 800 MG tablet Take 1 tablet (800 mg total) by mouth every 8 (eight) hours as needed. 01/27/15  Yes Katrina Harm, MD  lisinopril (PRINIVIL,ZESTRIL) 5 MG tablet Take 0.5 tablets (2.5 mg total) by mouth daily. 06/18/16  Yes Katrina Hoover, NP  metFORMIN (GLUCOPHAGE) 500 MG tablet Take 1 tablet (500 mg total) by mouth 2 (two) times daily with a meal. 06/18/16  Yes Katrina Hoover, NP  pravastatin (PRAVACHOL) 40 MG tablet Take 1 tablet (40 mg total) by mouth daily. 06/18/16  Yes Katrina Hoover, NP  traMADol (ULTRAM) 50 MG tablet Take 1 tablet (50 mg total) by mouth every 8 (eight) hours as needed. 01/31/15  Yes Katrina Maroon, FNP    Past Medical, Surgical Family and Social History reviewed and updated.    Objective:   Today's Vitals   11/13/16 1343  BP: 104/78  Pulse: 71  Temp: 98.4 F (36.9 C)  TempSrc: Oral  SpO2: 98%  Weight: 174 lb (78.9 kg)  Height:  (1.651 m)    Wt Readings from Last 3 Encounters:  11/13/16 174 lb (78.9 kg)  05/17/16 184 lb (83.5 kg)  12/23/15 180 lb (81.6 kg)    Physical Exam  Constitutional: She is oriented to person, place, and time. She appears well-developed and well-nourished.  HENT:  Head:  Normocephalic and atraumatic.  Nose: Nose normal.  Mouth/Throat: Oropharynx is clear and moist.  Eyes: Conjunctivae and EOM are normal. Pupils are equal, round, and reactive to light.  Neck: Normal range of motion. Neck supple. No thyromegaly present.  Cardiovascular: Normal rate, regular rhythm, normal heart sounds and intact distal pulses.   Pulmonary/Chest: Effort normal and breath sounds normal.  Abdominal: Soft. Bowel sounds are normal.  Musculoskeletal: Normal range of motion.  Neurological: She is alert and oriented to person, place, and time.  Skin: Skin is warm and dry.  Psychiatric: She has a normal mood and affect. Her behavior is normal. Judgment and thought content normal.       Assessment & Plan:  1. Uncontrolled type 2 diabetes mellitus without complication, without long-term current use of insulin (HCC) -Increase Metformin 1000 mg twice daily with meals -Start Onglyza 5 mg daily  -Aggressive management due to uncontrolled readings and patients A1C today  Is 8.7.  -Alazae is to return in 3 months for a repeat A1C.  2. Essential hypertension, controlled    -Continue lisinopril, Patient's last menstrual period was 10/23/2016.       3. Hyperlipidemia, unspecified hyperlipidemia type -Continue pravastatin    Check glucose daily at least once daily in the morning fasting.  Next visit you we will allot time for your annual pap smear and will address any other overdue health maintenance items.   Katrina [name]. Katrina Pea, MSN, [facility] [address] [place], [place] [address] [phone]

## 2016-11-13 NOTE — Patient Instructions (Addendum)
Start Saxtagliptan (Onglyza) 5 mg once daily. I have increased Metformin 1000 mg twice daily. Continue to check blood glucose at home. If readings are consistently more than 250 after being medication for at least 2 weeks, please return sooner than 3 months for follow-up. You will be notified of any abnormal lab results. I have refilled all other medications as requested.    Hiperglucemia (Hyperglycemia) La hiperglucemia se produce cuando el nivel de azcar (glucosa) en la sangre es demasiado alto. Algunos de los sntomas de nivel alto de azcar en la sangre son los siguientes:  Sentir que tiene lo siguiente:  Estar ms sediento que lo habitual.  Sentirse cansado.  Estar dbil.  Tener lo siguiente:  La boca seca.  Cambios en la visin, como visin borrosa.  Aliento con [employer] a fruta.  Aumento o prdida de peso no planificados. Probablemente pierda de peso muy rpidamente.  Hormigueo o adormecimiento en las manos o los pies.  Dolor de [place].  Cuando se pellizca la piel, esta no vuelve rpidamente a su lugar al soltarla.  Dolor en el vientre (abdomen).  Cortes o hematomas que tardan en curarse.  Orinar con mayor frecuencia que lo normal.  Tener ms hambre de lo habitual.  Tener menos deseos de comer que lo habitual (prdida del apetito). La hiperglucemia le puede ocurrir tanto a las personas que tienen diabetes como a las que no la tienen. Puede desarrollarse despacio o rpidamente y ser una emergencia mdica. CUIDADOS EN EL HOGAR  Si tiene diabetes, siga su plan de control de la diabetes. Haga lo siguiente:  [employer] segn las indicaciones.  Siga el plan de ejercicio.  Siga el plan de comidas.  Controle su nivel de azcar en la sangre peridicamente.  Controle su nivel de azcar en la sangre antes y despus de ejercitarse. Si hace ejercicio durante ms tiempo o de [employer] de lo habitual, asegrese de Chief Operating Officer su nivel de azcar en la  sangre con mayor frecuencia.  Use su pulsera de alerta mdica, que indica que usted tiene diabetes.  Beba suficiente lquido para mantener el pis claro o de color amarillo plido.  Mantenga un peso saludable con dieta y ejercicios. Pregntele a su mdico cul es su peso ideal.  No consuma ningn producto que contenga tabaco, lo que incluye cigarrillos, tabaco de Theatre manager y Administrator, Civil Service. Si necesita ayuda para dejar de fumar, consulte al mdico.  Limite el consumo de alcohol a no ms de por da si es mujer y no est embarazada y a por da si es hombre. Una medida equivale a 12onzas de cerveza, 5onzas de vino o 1onzas de bebidas alcohlicas de alta graduacin.  Concurra a todas las visitas de control como se lo haya indicado el mdico. Esto es importante. SOLICITE AYUDA SI:  Su nivel de azcar en la sangre est por encima del rango indicado en dos mediciones seguidas.  Tiene episodios frecuentes hiperglucemia. SOLICITE AYUDA DE INMEDIATO SI:  Tiene dificultad para respirar.  Tiene cambios en la manera se sentirse, pensar o actuar (estado mental).  Tiene ganas constantes de vomitar (nuseas).  No puede dejar de vomitar. Estos sntomas pueden Customer service manager. No espere hasta que los sntomas desaparezcan. Solicite atencin mdica de inmediato. Comunquese con el servicio de emergencias de su localidad (911 en los [place]). No conduzca por sus propios medios [employer]. Esta informacin no tiene Theme park manager el consejo del mdico. Asegrese de hacerle al mdico cualquier pregunta que  tenga. Document Released: 09/01/2010 Document Revised: 11/21/2015 Document Reviewed: 04/05/2015 Elsevier Interactive Patient Education  2017 [employer].

## 2016-11-14 LAB — THYROID PANEL WITH TSH
Free Thyroxine Index: 2.4 (ref 1.4–3.8)
T3 Uptake: 32 % (ref 22–35)
T4, Total: 7.5 ug/dL (ref 4.5–12.0)
TSH: 1.13 m[IU]/L

## 2016-11-15 NOTE — Progress Notes (Signed)
Left a vm for patient to callback 

## 2016-11-16 ENCOUNTER — Telehealth: Payer: Self-pay

## 2016-11-16 LAB — MICROALBUMIN, URINE: Microalb, Ur: 0.8 mg/dL

## 2016-11-16 NOTE — Telephone Encounter (Signed)
Spoke with paitient

## 2016-11-19 ENCOUNTER — Other Ambulatory Visit: Payer: Self-pay | Admitting: *Deleted

## 2016-11-19 MED ORDER — SITAGLIPTIN PHOSPHATE 100 MG PO TABS: 100.0000 mg | ORAL_TABLET | Freq: Every day | ORAL | 3 refills | Status: DC

## 2016-11-19 NOTE — Telephone Encounter (Signed)
Patient ineligible for PASS program. Patients Onglyza was switched to Januvia which is an affordable alternative.

## 2016-11-30 ENCOUNTER — Emergency Department (HOSPITAL_COMMUNITY): Payer: Self-pay

## 2016-11-30 ENCOUNTER — Other Ambulatory Visit: Payer: Self-pay

## 2016-11-30 ENCOUNTER — Encounter (HOSPITAL_COMMUNITY): Payer: Self-pay

## 2016-11-30 DIAGNOSIS — R0789 Other chest pain: Principal | ICD-10-CM | POA: Insufficient documentation

## 2016-11-30 DIAGNOSIS — I10 Essential (primary) hypertension: Secondary | ICD-10-CM | POA: Insufficient documentation

## 2016-11-30 DIAGNOSIS — Z7984 Long term (current) use of oral hypoglycemic drugs: Secondary | ICD-10-CM | POA: Insufficient documentation

## 2016-11-30 DIAGNOSIS — Z79899 Other long term (current) drug therapy: Secondary | ICD-10-CM | POA: Insufficient documentation

## 2016-11-30 DIAGNOSIS — E119 Type 2 diabetes mellitus without complications: Secondary | ICD-10-CM | POA: Insufficient documentation

## 2016-11-30 LAB — CBC
HEMATOCRIT: 38.1 % (ref 36.0–46.0)
Hemoglobin: 12.7 g/dL (ref 12.0–15.0)
MCH: 30 pg (ref 26.0–34.0)
MCHC: 33.3 g/dL (ref 30.0–36.0)
MCV: 90.1 fL (ref 78.0–100.0)
PLATELETS: 231 10*3/uL (ref 150–400)
RBC: 4.23 MIL/uL (ref 3.87–5.11)
RDW: 13.1 % (ref 11.5–15.5)
WBC: 7.6 10*3/uL (ref 4.0–10.5)

## 2016-11-30 LAB — BASIC METABOLIC PANEL
Anion gap: 9 (ref 5–15)
BUN: 12 mg/dL (ref 6–20)
CHLORIDE: 104 mmol/L (ref 101–111)
CO2: 24 mmol/L (ref 22–32)
CREATININE: 0.65 mg/dL (ref 0.44–1.00)
Calcium: 9.2 mg/dL (ref 8.9–10.3)
GFR calc Af Amer: >60 mL/min (ref >60)
GFR calc non Af Amer: >60 mL/min (ref >60)
Glucose, Bld: 268 mg/dL — ABNORMAL HIGH (ref 65–99)
POTASSIUM: 3.7 mmol/L (ref 3.5–5.1)
Sodium: 137 mmol/L (ref 135–145)

## 2016-11-30 LAB — TROPONIN I: Troponin I: <0.03 ng/mL (ref <0.03)

## 2016-11-30 NOTE — ED Triage Notes (Signed)
Pt states that around 10 am she started having central and L sided CP that radiates to her L neck and L side of jaw, with SOB, denies n/v, some dizziness. Pain is 8/10

## 2016-12-01 ENCOUNTER — Encounter (HOSPITAL_COMMUNITY): Payer: Self-pay | Admitting: Family Medicine

## 2016-12-01 ENCOUNTER — Observation Stay (HOSPITAL_COMMUNITY)
Admission: EM | Admit: 2016-12-01 | Discharge: 2016-12-01 | Disposition: A | Discharge status: Home / Self Care | Payer: Self-pay | Attending: Internal Medicine | Admitting: Internal Medicine

## 2016-12-01 ENCOUNTER — Observation Stay (HOSPITAL_BASED_OUTPATIENT_CLINIC_OR_DEPARTMENT_OTHER): Payer: Self-pay

## 2016-12-01 DIAGNOSIS — E1165 Type 2 diabetes mellitus with hyperglycemia: Secondary | ICD-10-CM

## 2016-12-01 DIAGNOSIS — R739 Hyperglycemia, unspecified: Secondary | ICD-10-CM | POA: Diagnosis present

## 2016-12-01 DIAGNOSIS — R072 Precordial pain: Secondary | ICD-10-CM

## 2016-12-01 DIAGNOSIS — E785 Hyperlipidemia, unspecified: Secondary | ICD-10-CM | POA: Diagnosis present

## 2016-12-01 DIAGNOSIS — I10 Essential (primary) hypertension: Secondary | ICD-10-CM | POA: Diagnosis present

## 2016-12-01 DIAGNOSIS — R0789 Other chest pain: Secondary | ICD-10-CM

## 2016-12-01 DIAGNOSIS — IMO0002 Reserved for concepts with insufficient information to code with codable children: Secondary | ICD-10-CM | POA: Diagnosis present

## 2016-12-01 DIAGNOSIS — R079 Chest pain, unspecified: Secondary | ICD-10-CM | POA: Diagnosis present

## 2016-12-01 LAB — TROPONIN I
Troponin I: <0.03 ng/mL (ref <0.03)
Troponin I: <0.03 ng/mL (ref <0.03)

## 2016-12-01 LAB — GLUCOSE, CAPILLARY
GLUCOSE-CAPILLARY: 133 mg/dL — AB (ref 65–99)
GLUCOSE-CAPILLARY: 171 mg/dL — AB (ref 65–99)
Glucose-Capillary: 174 mg/dL — ABNORMAL HIGH (ref 65–99)

## 2016-12-01 LAB — ECHOCARDIOGRAM COMPLETE: Weight: 2800 oz

## 2016-12-01 LAB — HIV ANTIBODY (ROUTINE TESTING W REFLEX): HIV SCREEN 4TH GENERATION: NONREACTIVE

## 2016-12-01 LAB — MRSA PCR SCREENING: MRSA by PCR: NEGATIVE

## 2016-12-01 MED ORDER — INSULIN ASPART 100 UNIT/ML ~~LOC~~ SOLN
0.0000 [IU] | Freq: Three times a day (TID) | SUBCUTANEOUS | Status: DC
  Administered 2016-12-01: 2 [IU] via SUBCUTANEOUS
  Administered 2016-12-01: 3 [IU] via SUBCUTANEOUS

## 2016-12-01 MED ORDER — MORPHINE SULFATE (PF) 4 MG/ML IV SOLN: 1.0000 mg | INTRAVENOUS | Status: DC | PRN

## 2016-12-01 MED ORDER — ASPIRIN 81 MG PO TBEC: 81.0000 mg | DELAYED_RELEASE_TABLET | Freq: Every day | ORAL | 0 refills | Status: AC

## 2016-12-01 MED ORDER — ASPIRIN 81 MG PO CHEW
324.0000 mg | CHEWABLE_TABLET | Freq: Once | ORAL | Status: AC
  Administered 2016-12-01: 324 mg via ORAL
  Filled 2016-12-01: qty 4

## 2016-12-01 MED ORDER — INSULIN ASPART 100 UNIT/ML ~~LOC~~ SOLN: 0.0000 [IU] | Freq: Every day | SUBCUTANEOUS | Status: DC

## 2016-12-01 MED ORDER — ONDANSETRON HCL 4 MG/2ML IJ SOLN: 4.0000 mg | Freq: Four times a day (QID) | INTRAMUSCULAR | Status: DC | PRN

## 2016-12-01 MED ORDER — ACETAMINOPHEN 325 MG PO TABS
650.0000 mg | ORAL_TABLET | ORAL | Status: DC | PRN
Start: 2016-12-01 — End: 2016-12-01

## 2016-12-01 MED ORDER — LISINOPRIL 2.5 MG PO TABS
2.5000 mg | ORAL_TABLET | Freq: Every day | ORAL | Status: DC
  Administered 2016-12-01: 2.5 mg via ORAL
  Filled 2016-12-01: qty 1

## 2016-12-01 MED ORDER — NITROGLYCERIN 0.4 MG SL SUBL
0.4000 mg | SUBLINGUAL_TABLET | SUBLINGUAL | Status: DC | PRN
Start: 2016-12-01 — End: 2016-12-01

## 2016-12-01 MED ORDER — ASPIRIN EC 81 MG PO TBEC: 81.0000 mg | DELAYED_RELEASE_TABLET | Freq: Every day | ORAL | Status: DC

## 2016-12-01 MED ORDER — ENOXAPARIN SODIUM 40 MG/0.4ML ~~LOC~~ SOLN
40.0000 mg | SUBCUTANEOUS | Status: DC
  Administered 2016-12-01: 40 mg via SUBCUTANEOUS
  Filled 2016-12-01 (×2): qty 0.4

## 2016-12-01 MED ORDER — NITROGLYCERIN 0.4 MG SL SUBL: 0.4000 mg | SUBLINGUAL_TABLET | SUBLINGUAL | Status: DC | PRN

## 2016-12-01 MED ORDER — PRAVASTATIN SODIUM 40 MG PO TABS: 40.0000 mg | ORAL_TABLET | Freq: Every day | ORAL | Status: DC

## 2016-12-01 NOTE — Discharge Instructions (Signed)
Dolor en la pared torácica  (Chest Wall Pain)  El dolor en la pared torácica se produce en los huesos y los músculos del pecho o alrededor de estos. A veces, una lesión causa este dolor. En ocasiones, la causa puede ser desconocida. Este dolor puede durar varias semanas.  INSTRUCCIONES PARA EL CUIDADO EN EL HOGAR  Esté atento a cualquier cambio en los síntomas. Tome estas medidas para aliviar el dolor:  · Haga reposo como se lo haya indicado el médico.  · Evite las actividades que causan dolor. Estas pueden ser aquellas que requieren el uso de los músculos del tórax, los abdominales o los laterales para levantar objetos pesados.  · Si se lo indican, aplique hielo sobre la zona dolorida:  ? Ponga el hielo en una bolsa plástica.  ? Coloque una toalla entre la piel y la bolsa de hielo.  ? Coloque el hielo durante 20 minutos, 2 a 3 veces por día.  · Tome los medicamentos de venta libre y los recetados solamente como se lo haya indicado el médico.  · No consuma productos que contengan tabaco, incluidos cigarrillos, tabaco de mascar y cigarrillos electrónicos. Si necesita ayuda para dejar de fumar, consulte al médico.  · Concurra a todas las visitas de control como se lo haya indicado el médico. Esto es importante.  SOLICITE ATENCIÓN MÉDICA SI:  · Tiene fiebre.  · El dolor de pecho empeora.  · Aparecen nuevos síntomas.    SOLICITE ATENCIÓN MÉDICA DE INMEDIATO SI:  · Tiene náuseas o vómitos.  · Transpira o tiene sensación de desvanecimiento.  · Tiene tos con flema (esputo) o expectora sangre al toser.  · Le falta el aire.    Esta información no tiene como fin reemplazar el consejo del médico. Asegúrese de hacerle al médico cualquier pregunta que tenga.  Document Released: 09/10/2006 Document Revised: 04/20/2015 Document Reviewed: 10/25/2014  Elsevier Interactive Patient Education © 2017 Elsevier Inc.

## 2016-12-01 NOTE — Plan of Care (Signed)
Problem: Pain Managment: Goal: General experience of comfort will improve Outcome: Progressing No complain of pain or discomfort.   

## 2016-12-01 NOTE — Discharge Summary (Addendum)
Katrina [name], is a 46 y.o. female  DOB Mar 25, 1971  MRN [id number].  Admission date:  12/01/2016  Admitting Physician  Briscoe Deutscher, MD  Discharge Date:  12/01/2016   Primary MD  Joaquin Courts, FNP  Recommendations for primary care physician for things to follow:   Cp Likely secondary to costochondritis Pt can take tylenol otc prn Please f/u with pcp in 1 week   Dm2 Cont metformin, onglyza,  Due to her diabetes, consider stress testing as an outpatient but clinically has costochondritis   Hypertension Cont lisinopril   Hyperlipidemia Cont pravastatin  Admission Diagnosis  Other chest pain [R07.89]   Discharge Diagnosis  Other chest pain [R07.89]    Principal Problem:   Chest pain Active Problems:   Hyperglycemia   Dyslipidemia   Diabetes mellitus type 2, uncontrolled (HCC)   Essential hypertension      Past Medical History:  Diagnosis Date  . Diabetes mellitus without complication (HCC)   . Hypertension     Past Surgical History:  Procedure Laterality Date  . HERNIA REPAIR         HPI  from the history and physical done on the day of admission:      46 y.o. female with medical history significant for type 2 diabetes mellitus, hypertension, and hyperlipidemia who presents to the emergency department for evaluation of chest pain. Patient reports that she had been in her usual state of health until around 10 or 11 AM this morning, while at work, she experienced central and left-sided chest pain with radiation to the left neck and jaw. She describes this as moderate to severe in intensity, sharp in character, recurrent, lasting approximately 10 seconds at a time, and associated with shortness of breath. She is not able to identify any alleviating or exacerbating factors. She has never experienced similar symptoms previously. She is a nonsmoker and denies any family history of  premature myocardial infarction. She did not attempt any interventions prior to coming in. There is been no lower extremity swelling or orthopnea. She denies headache, change in vision or hearing, or focal numbness or weakness.  ED Course: Upon arrival to the ED, patient is found to be afebrile, saturating well on room air, and with vital signs stable. EKG features a normal sinus rhythm with low voltage QRS. Chest x-ray is negative for acute cardiac pulmonary disease. Chemistry panels not only for a glucose of 268. CBC is unremarkable and troponin is undetectable. Patient was treated with 324 mg aspirin in the ED. She continued to have intermittent chest pain. She has remained hemodynamically stable and in no apparent respiratory distress, and will be observed on the telemetry unit for ongoing evaluation and management of chest pain concerning for possible ACS.   Hospital Course:     Pt was admitted and pt did not have any further cp.  With translator (her friend), pt states that the chest pain "sharp" reproducible to palpation. Only lasted for a few seconds.  The neck pain seems  separate from the chest discomfort in the left upper chest and is worse with movement.  There was no arm pain. Trop I were all negative.  Cardiac echo showed preserved EF and no wall motion abnormality . CXR negative,  Pt wishes to be discharge to home with outpatient follow up.   Follow UP  Follow-up Information    Joaquin Courts, FNP Follow up in 1 week(s).   Specialty:  Family Medicine Contact information: [address] [place] [address] [phone]            Consults obtained - none  Discharge Condition: stable  Diet and Activity recommendation: See Discharge Instructions below  Discharge Instructions         Discharge Medications     Allergies as of 12/01/2016   No Known Allergies     Medication List    STOP taking these medications   sitaGLIPtin 100 MG tablet Commonly known as:   JANUVIA     TAKE these medications   aspirin 81 MG EC tablet Take 1 tablet (81 mg total) by mouth daily. Start taking on:  12/02/2016   lisinopril 5 MG tablet Commonly known as:  PRINIVIL,ZESTRIL Take 0.5 tablets (2.5 mg total) by mouth daily.   metFORMIN 500 MG tablet Commonly known as:  GLUCOPHAGE Take 2 tablets (1,000 mg total) by mouth 2 (two) times daily with a meal.   pravastatin 40 MG tablet Commonly known as:  PRAVACHOL Take 1 tablet (40 mg total) by mouth daily.   saxagliptin HCl 5 MG Tabs tablet Commonly known as:  ONGLYZA Take 1 tablet (5 mg total) by mouth daily.       Major procedures and Radiology Reports - PLEASE review detailed and final reports for all details, in brief -      Dg Chest 2 View  Result Date: 11/30/2016 CLINICAL DATA:  Acute onset of central and left-sided chest pain, radiating to the left neck and left side of the jaw. Shortness of breath. Dizziness. Initial encounter. EXAM: CHEST  2 VIEW COMPARISON:  None. FINDINGS: The lungs are well-aerated and clear. There is no evidence of focal opacification, pleural effusion or pneumothorax. The heart is normal in size; the mediastinal contour is within normal limits. No acute osseous abnormalities are seen. IMPRESSION: No acute cardiopulmonary process seen. Electronically Signed   By: Roanna Raider M.D.   On: 11/30/2016 21:48    Micro Results    Recent Results (from the past 240 hour(s))  MRSA PCR Screening     Status: None   Collection Time: 12/01/16  3:53 AM  Result Value Ref Range Status   MRSA by PCR NEGATIVE NEGATIVE Final    Comment:        The GeneXpert MRSA Assay (FDA approved for NASAL specimens only), is one component of a comprehensive MRSA colonization surveillance program. It is not intended to diagnose MRSA infection nor to guide or monitor treatment for MRSA infections.        Today   Subjective    Katrina [name] today has no headache,no chest abdominal pain,no new  weakness tingling or numbness, feels much better wants to go home today.    Objective   Blood pressure 140/80, pulse (!) 57, temperature 98.1 F (36.7 C), temperature source Oral, resp. rate 16, weight 79.4 kg (175 lb), last menstrual period 10/23/2016, SpO2 100 %.   Intake/Output Summary (Last 24 hours) at 12/01/16 1754 Last data filed at 12/01/16 0800  Gross per 24 hour  Intake                0 ml  Output                0 ml  Net                0 ml    Exam Awake Alert, Oriented x 3, No new F.N deficits, Normal affect [place].AT,PERRAL Supple Neck,No JVD, No cervical lymphadenopathy appriciated.  Symmetrical Chest wall movement, Good air movement bilaterally, CTAB,  Pt has reproducible chest pain over the left upper chest with palpation.  RRR,No Gallops,Rubs or new Murmurs, No Parasternal Heave +ve B.Sounds, Abd Soft, Non tender, No organomegaly appriciated, No rebound -guarding or rigidity. No Cyanosis, Clubbing or edema, No new Rash or bruise   Data Review   CBC w Diff: Lab Results  Component Value Date   WBC 7.6 11/30/2016   HGB 12.7 11/30/2016   HCT 38.1 11/30/2016   PLT 231 11/30/2016   LYMPHOPCT 49 11/13/2016   MONOPCT 6 11/13/2016   EOSPCT 2 11/13/2016   BASOPCT 0 11/13/2016    CMP: Lab Results  Component Value Date   NA 137 11/30/2016   K 3.7 11/30/2016   CL 104 11/30/2016   CO2 24 11/30/2016   BUN 12 11/30/2016   CREATININE 0.65 11/30/2016   CREATININE 0.59 11/13/2016   PROT 7.5 11/13/2016   ALBUMIN 4.0 11/13/2016   BILITOT 0.2 11/13/2016   ALKPHOS 85 11/13/2016   AST 12 11/13/2016   ALT 16 11/13/2016  .   Total Time in preparing paper work, data evaluation and todays exam - 35 minutes  Pearson Grippe M.D on 12/01/2016 at 5:54 PM  [facility]   Office  [phone]

## 2016-12-01 NOTE — ED Provider Notes (Signed)
TIME SEEN: 1:38 AM  By signing my name below, I, Katrina [name], attest that this documentation has been prepared under the direction and in the presence of [employer], DO.  Electronically Signed: Octavia [name], ED Scribe. 12/01/16. 1:59 AM.  CHIEF COMPLAINT:  Chief Complaint  Patient presents with  . Chest Pain     HPI:   HPI Comments: Katrina [name] is a 46 y.o. female who presents with a PMHx of DMII, DLD, and HTN to the Emergency Department complaining of moderate, progressively worsening, left sided chest pain that started yesterday morning around 10-11 am. She describes the pain as a pressure-like sensation, further noting "it feels like someone is pinching me with a needle". The pain radiates into her left shoulder, left sided neck, and around her left breast. Pt reports associated shortness of breath and dizziness. She also notes while waiting in the ED waiting room, she describes having one episode of her "heart shaking" but has not had another since. Pt expresses there are no modifying or aggravating factors. Denies PMHx of MI, CAD, or DVT in legs or lung. She has never had a stress test or cardiac catheterization performed. Denies family PMHx of MI. She denies having nausea, vomiting or diaphoresis. Pt is a non-smoker.  PCP: Katrina Courts, FNP at [facility].   ROS: See HPI Constitutional: no fever  Eyes: no drainage  ENT: no runny nose   Cardiovascular:  (+) chest pain  Resp: (+) SOB  GI: no vomiting GU: no dysuria Integumentary: no rash  Allergy: no hives  Musculoskeletal: no leg swelling  Neurological: no slurred speech ROS otherwise negative  PAST MEDICAL HISTORY/PAST SURGICAL HISTORY:  Past Medical History:  Diagnosis Date  . Diabetes mellitus without complication (HCC)   . Hypertension     MEDICATIONS:  Prior to Admission medications   Medication Sig Start Date End Date Taking? Authorizing Provider  ibuprofen (ADVIL,MOTRIN) 200 MG tablet Take  200 mg by mouth every 6 (six) hours as needed for moderate pain.    Historical Provider, MD  ibuprofen (ADVIL,MOTRIN) 800 MG tablet Take 1 tablet (800 mg total) by mouth every 8 (eight) hours as needed. 01/27/15   Katrina Harm, MD  lisinopril (PRINIVIL,ZESTRIL) 5 MG tablet Take 0.5 tablets (2.5 mg total) by mouth daily. 11/13/16   Katrina Askew, FNP  metFORMIN (GLUCOPHAGE) 500 MG tablet Take 2 tablets (1,000 mg total) by mouth 2 (two) times daily with a meal. 11/13/16   Katrina Askew, FNP  pravastatin (PRAVACHOL) 40 MG tablet Take 1 tablet (40 mg total) by mouth daily. 11/13/16   Katrina Askew, FNP  saxagliptin HCl (ONGLYZA) 5 MG TABS tablet Take 1 tablet (5 mg total) by mouth daily. 11/13/16   Katrina Askew, FNP  sitaGLIPtin (JANUVIA) 100 MG tablet Take 1 tablet (100 mg total) by mouth daily. 11/19/16   Katrina Angst, MD  traMADol (ULTRAM) 50 MG tablet Take 1 tablet (50 mg total) by mouth every 8 (eight) hours as needed. 01/31/15   Katrina Maroon, FNP    ALLERGIES:  No Known Allergies  SOCIAL HISTORY:  Social History  Substance Use Topics  . Smoking status: Never Smoker  . Smokeless tobacco: Never Used  . Alcohol use No    FAMILY HISTORY: Family History  Problem Relation Age of Onset  . Diabetes Mother   . Diabetes Father   . Diabetes Sister   . Diabetes Brother     EXAM: BP 101/75 (BP  Location: Left Arm)   Pulse 66   Temp 98.3 F (36.8 C) (Oral)   Resp 20   LMP 10/23/2016   SpO2 98%  CONSTITUTIONAL: Alert and oriented and responds appropriately to questions. Well-appearing; well-nourished HEAD: Normocephalic EYES: Conjunctivae clear, pupils appear equal, EOMI ENT: normal nose; moist mucous membranes NECK: Supple, no meningismus, no nuchal rigidity, no LAD  CARD: RRR; S1 and S2 appreciated; no murmurs, no clicks, no rubs, no gallops RESP: Normal chest excursion without splinting or tachypnea; breath sounds clear and equal  bilaterally; no wheezes, no rhonchi, no rales, no hypoxia or respiratory distress, speaking full sentences ABD/GI: Normal bowel sounds; non-distended; soft, non-tender, no rebound, no guarding, no peritoneal signs, no hepatosplenomegaly BACK:  The back appears normal and is non-tender to palpation, there is no CVA tenderness EXT: Normal ROM in all joints; non-tender to palpation; no edema; normal capillary refill; no cyanosis, no calf tenderness or swelling    SKIN: Normal color for age and race; warm; no rash NEURO: Moves all extremities equally PSYCH: The patient's mood and manner are appropriate. Grooming and personal hygiene are appropriate.  MEDICAL DECISION MAKING: Patient here complains of chest pain. Her heart score is 4. Doubt dissection or PE. EKG shows no ischemic abnormality. First troponin negative. Chest x-ray clear. Will admit for chest pain rule out. We'll give aspirin, nitroglycerin. Patient comfortable with this plan.  ED PROGRESS: 2:03 AM Discussed patient's case with hospitalist, Dr. Antionette Char.  I have recommended admission and patient (and family if present) agree with this plan. Admitting physician will place admission orders.   I reviewed all nursing notes, vitals, pertinent previous records, EKGs, lab and urine results, imaging (as available).     EKG Interpretation  Date/Time:  Friday November 30 2016 21:20:25 EDT Ventricular Rate:  66 PR Interval:  156 QRS Duration: 88 QT Interval:  404 QTC Calculation: 423 R Axis:   -29 Text Interpretation:  Normal sinus rhythm Low voltage QRS Borderline ECG No old tracing to compare Confirmed by WARD,  DO, KRISTEN ([id number]) on 12/01/2016 1:29:17 AM       I personally performed the services described in this documentation, which was scribed in my presence. The recorded information has been reviewed and is accurate.     Katrina Maw Ward, DO 12/01/16 [phone]

## 2016-12-01 NOTE — Progress Notes (Signed)
  Echocardiogram 2D Echocardiogram has been performed.  Katrina [name] M 12/01/2016, 3:19 PM

## 2016-12-01 NOTE — H&P (Signed)
History and Physical    Katrina [name] ZOX:[id number] DOB: 06/16/1971 DOA: 12/01/2016  PCP: Joaquin Courts, FNP   Patient coming from: Home  Chief Complaint: Chest pain   HPI: Katrina [name] is a 46 y.o. female with medical history significant for type 2 diabetes mellitus, hypertension, and hyperlipidemia who presents to the emergency department for evaluation of chest pain. Patient reports that she had been in her usual state of health until around 10 or 11 AM this morning, while at work, she experienced central and left-sided chest pain with radiation to the left neck and jaw. She describes this as moderate to severe in intensity, sharp in character, recurrent, lasting approximately 10 seconds at a time, and associated with shortness of breath. She is not able to identify any alleviating or exacerbating factors. She has never experienced similar symptoms previously. She is a nonsmoker and denies any family history of premature myocardial infarction. She did not attempt any interventions prior to coming in. There is been no lower extremity swelling or orthopnea. She denies headache, change in vision or hearing, or focal numbness or weakness.  ED Course: Upon arrival to the ED, patient is found to be afebrile, saturating well on room air, and with vital signs stable. EKG features a normal sinus rhythm with low voltage QRS. Chest x-ray is negative for acute cardiac pulmonary disease. Chemistry panels not only for a glucose of 268. CBC is unremarkable and troponin is undetectable. Patient was treated with 324 mg aspirin in the ED. She continued to have intermittent chest pain. She has remained hemodynamically stable and in no apparent respiratory distress, and will be observed on the telemetry unit for ongoing evaluation and management of chest pain concerning for possible ACS.  Review of Systems:  All other systems reviewed and apart from HPI, are negative.  Past Medical History:  Diagnosis Date  .  Diabetes mellitus without complication (HCC)   . Hypertension     Past Surgical History:  Procedure Laterality Date  . HERNIA REPAIR       reports that she has never smoked. She has never used smokeless tobacco. She reports that she does not drink alcohol or use drugs.  No Known Allergies  Family History  Problem Relation Age of Onset  . Diabetes Mother   . Diabetes Father   . Diabetes Sister   . Diabetes Brother      Prior to Admission medications   Medication Sig Start Date End Date Taking? Authorizing Provider  lisinopril (PRINIVIL,ZESTRIL) 5 MG tablet Take 0.5 tablets (2.5 mg total) by mouth daily. 11/13/16  Yes Doyle Askew, FNP  metFORMIN (GLUCOPHAGE) 500 MG tablet Take 2 tablets (1,000 mg total) by mouth 2 (two) times daily with a meal. 11/13/16  Yes Doyle Askew, FNP  pravastatin (PRAVACHOL) 40 MG tablet Take 1 tablet (40 mg total) by mouth daily. 11/13/16  Yes Doyle Askew, FNP  saxagliptin HCl (ONGLYZA) 5 MG TABS tablet Take 1 tablet (5 mg total) by mouth daily. 11/13/16  Yes Doyle Askew, FNP  sitaGLIPtin (JANUVIA) 100 MG tablet Take 1 tablet (100 mg total) by mouth daily. 11/19/16  Yes Quentin Angst, MD  ibuprofen (ADVIL,MOTRIN) 800 MG tablet Take 1 tablet (800 mg total) by mouth every 8 (eight) hours as needed. Patient not taking: Reported on 12/01/2016 01/27/15   Altha Harm, MD  traMADol (ULTRAM) 50 MG tablet Take 1 tablet (50 mg total) by mouth every 8 (eight) hours as needed. Patient not taking:  Reported on 12/01/2016 01/31/15   Massie Maroon, FNP    Physical Exam: Vitals:   11/30/16 2124 12/01/16 0035 12/01/16 0130 12/01/16 0200  BP: 112/81 101/75 113/75   Pulse: 69 66 (!) 49 (!) 58  Resp: Temp: 98.4 F (36.9 C) 98.3 F (36.8 C)    TempSrc: Oral Oral    SpO2: 100% 98% 100% 100%      Constitutional: NAD, calm, appears uncomfortable, anxious Eyes: PERTLA, lids and conjunctivae  normal ENMT: Mucous membranes are moist. Posterior pharynx clear of any exudate or lesions.   Neck: normal, supple, no masses, no thyromegaly Respiratory: clear to auscultation bilaterally, no wheezing, no crackles. Normal respiratory effort.   Cardiovascular: S1 & S2 heard, regular rate and rhythm, no significant murmur. No extremity edema. 2+ pedal pulses. No significant JVD. Abdomen: No distension, no tenderness, no masses palpated. Bowel sounds normal.  Musculoskeletal: no clubbing / cyanosis. No joint deformity upper and lower extremities.   Skin: no significant rashes, lesions, ulcers. Warm, dry, well-perfused. Neurologic: CN 2-12 grossly intact. Sensation intact, DTR normal. Strength 5/5 in all 4 limbs.  Psychiatric: Alert and oriented x 3. Anxious. Pleasant and cooperative.     Labs on Admission: I have personally reviewed following labs and imaging studies  CBC:  Recent Labs Lab 11/30/16 2130  WBC 7.6  HGB 12.7  HCT 38.1  MCV 90.1  PLT 231   Basic Metabolic Panel:  Recent Labs Lab 11/30/16 2130  NA 137  K 3.7  CL 104  CO2 24  GLUCOSE 268*  BUN 12  CREATININE 0.65  CALCIUM 9.2   GFR: CrCl cannot be calculated (Unknown ideal weight.). Liver Function Tests: No results for input(s): AST, ALT, ALKPHOS, BILITOT, PROT, ALBUMIN in the last 168 hours. No results for input(s): LIPASE, AMYLASE in the last 168 hours. No results for input(s): AMMONIA in the last 168 hours. Coagulation Profile: No results for input(s): INR, PROTIME in the last 168 hours. Cardiac Enzymes:  Recent Labs Lab 11/30/16 2130  TROPONINI <0.03   BNP (last 3 results) No results for input(s): PROBNP in the last 8760 hours. HbA1C: No results for input(s): HGBA1C in the last 72 hours. CBG: No results for input(s): GLUCAP in the last 168 hours. Lipid Profile: No results for input(s): CHOL, HDL, LDLCALC, TRIG, CHOLHDL, LDLDIRECT in the last 72 hours. Thyroid Function Tests: No results for  input(s): TSH, T4TOTAL, FREET4, T3FREE, THYROIDAB in the last 72 hours. Anemia Panel: No results for input(s): VITAMINB12, FOLATE, FERRITIN, TIBC, IRON, RETICCTPCT in the last 72 hours. Urine analysis:    Component Value Date/Time   COLORURINE YELLOW 10/01/2014 1507   APPEARANCEUR TURBID (A) 10/01/2014 1507   LABSPEC 1.025 12/07/2014 1625   PHURINE 5.5 12/07/2014 1625   GLUCOSEU NEGATIVE 12/07/2014 1625   HGBUR TRACE (A) 12/07/2014 1625   BILIRUBINUR NEGATIVE 12/07/2014 1625   KETONESUR NEGATIVE 12/07/2014 1625   PROTEINUR NEGATIVE 12/07/2014 1625   UROBILINOGEN 1.0 12/07/2014 1625   NITRITE NEGATIVE 12/07/2014 1625   LEUKOCYTESUR NEGATIVE 12/07/2014 1625   Sepsis Labs: (procalcitonin:4,lacticidven:4) )No results found for this or any previous visit (from the past 240 hour(s)).   Radiological Exams on Admission: Dg Chest 2 View  Result Date: 11/30/2016 CLINICAL DATA:  Acute onset of central and left-sided chest pain, radiating to the left neck and left side of the jaw. Shortness of breath. Dizziness. Initial encounter. EXAM: CHEST  2 VIEW COMPARISON:  None. FINDINGS: The lungs are well-aerated and  clear. There is no evidence of focal opacification, pleural effusion or pneumothorax. The heart is normal in size; the mediastinal contour is within normal limits. No acute osseous abnormalities are seen. IMPRESSION: No acute cardiopulmonary process seen. Electronically Signed   By: Roanna Raider M.D.   On: 11/30/2016 21:48    EKG: Independently reviewed. Normal sinus rhythm, low-voltage QRS.  Assessment/Plan  1. Chest pain  - Pt presents with recurrent chest pain episodes, starting the day of presentation - There is no known CAD, but her risk-factors include HTN, DM, and HLD  - Initial troponin undetectable and EKG is without apparent ischemic features  - She was treated with 324 mg ASA in ED  - Beta-blocker precluded by bradycardia  - Monitor on telemetry for ischemic  changes, obtain serial troponin measurements, repeat EKG in am, and continue lisinopril and Pravachol    2. Type II DM  - A1c 8.7% earlier this month  - Managed at home with metformin, saxagliptin, and sitagliptin; these are held   - Check CBG with meals and qHS - Start a moderate-intensity sliding-scale correctional  3. Hypertension  - BP is at goal  - Continue lisinopril    4. Hyperlipidemia  - Lipid panel earlier this month with HDL 37, LDL 123  - Continue Pravachol   DVT prophylaxis: sq Lovenox Code Status: Full  Family Communication: Daughter updated at bedside Disposition Plan: Observe in SDU Consults called: None Admission status: Observation    Briscoe Deutscher, MD [facility] Pager [phone]  If 7PM-7AM, please contact night-coverage [email or web address] Password [facility]  12/01/2016, 2:31 AM

## 2016-12-07 MED FILL — LISINOPRIL 5 MG TAB: 5 | 30 days supply | Qty: 15 | Fill #3

## 2016-12-07 MED FILL — PRAVASTATIN NA 40 MG TAB: 40 | 30 days supply | Qty: 30 | Fill #3

## 2016-12-12 ENCOUNTER — Encounter: Payer: Self-pay | Admitting: Family Medicine

## 2016-12-12 ENCOUNTER — Ambulatory Visit (INDEPENDENT_AMBULATORY_CARE_PROVIDER_SITE_OTHER): Payer: Self-pay | Admitting: Family Medicine

## 2016-12-12 VITALS — BP 114/78 | HR 60 | Temp 97.6°F | Resp 18 | Ht 65.0 in | Wt 171.0 lb

## 2016-12-12 DIAGNOSIS — E119 Type 2 diabetes mellitus without complications: Secondary | ICD-10-CM

## 2016-12-12 LAB — GLUCOSE, CAPILLARY: Glucose-Capillary: 163 mg/dL — ABNORMAL HIGH (ref 65–99)

## 2016-12-12 LAB — POCT URINALYSIS DIP (DEVICE)
Bilirubin Urine: NEGATIVE
GLUCOSE, UA: 100 mg/dL — AB
Hgb urine dipstick: NEGATIVE
KETONES UR: NEGATIVE mg/dL
LEUKOCYTES UA: NEGATIVE
Nitrite: NEGATIVE
PROTEIN: NEGATIVE mg/dL
SPECIFIC GRAVITY, URINE: 1.025 (ref 1.005–1.030)
Urobilinogen, UA: 0.2 mg/dL (ref 0.0–1.0)
pH: 5.5 (ref 5.0–8.0)

## 2016-12-12 MED ORDER — GLIPIZIDE 5 MG PO TABS: 5.0000 mg | ORAL_TABLET | Freq: Every day | ORAL | 1 refills | Status: DC

## 2016-12-12 MED ORDER — SAXAGLIPTIN HCL 5 MG PO TABS: 5.0000 mg | ORAL_TABLET | Freq: Every day | ORAL | 0 refills | Status: DC

## 2016-12-12 NOTE — Patient Instructions (Addendum)
Please continue to check your blood sugar in the morning fasting and at bedtime, minimally 2-3 hours after eating.  I am adding Glipizide 5 mg, take daily with breakfast.   Reduce intake of Tortillas, cheese, and beans to maximum of 3 times weekly.  Increase physical activity to 15-20 minutes 3-5 times weekly.     Diabetes Mellitus and Exercise Exercising regularly is important for your overall health, especially when you have diabetes (diabetes mellitus). Exercising is not only about losing weight. It has many health benefits, such as increasing muscle strength and bone density and reducing body fat and stress. This leads to improved fitness, flexibility, and endurance, all of which result in better overall health. Exercise has additional benefits for people with diabetes, including:  Reducing appetite.  Helping to lower and control blood glucose.  Lowering blood pressure.  Helping to control amounts of fatty substances (lipids) in the blood, such as cholesterol and triglycerides.  Helping the body to respond better to insulin (improving insulin sensitivity).  Reducing how much insulin the body needs.  Decreasing the risk for heart disease by:  Lowering cholesterol and triglyceride levels.  Increasing the levels of good cholesterol.  Lowering blood glucose levels. What is my activity plan? Your health care provider or certified diabetes educator can help you make a plan for the type and frequency of exercise (activity plan) that works for you. Make sure that you:  Do at least 150 minutes of moderate-intensity or vigorous-intensity exercise each week. This could be brisk walking, biking, or water aerobics.  Do stretching and strength exercises, such as yoga or weightlifting, at least 2 times a week.  Spread out your activity over at least 3 days of the week.  Get some form of physical activity every day.  Do not go more than 2 days in a row without some kind of physical  activity.  Avoid being inactive for more than 90 minutes at a time. Take frequent breaks to walk or stretch.  Choose a type of exercise or activity that you enjoy, and set realistic goals.  Start slowly, and gradually increase the intensity of your exercise over time. What do I need to know about managing my diabetes?  Check your blood glucose before and after exercising.  If your blood glucose is higher than 240 mg/dL (96.0 mmol/L) before you exercise, check your urine for ketones. If you have ketones in your urine, do not exercise until your blood glucose returns to normal.  Know the symptoms of low blood glucose (hypoglycemia) and how to treat it. Your risk for hypoglycemia increases during and after exercise. Common symptoms of hypoglycemia can include:  Hunger.  Anxiety.  Sweating and feeling clammy.  Confusion.  Dizziness or feeling light-headed.  Increased heart rate or palpitations.  Blurry vision.  Tingling or numbness around the mouth, lips, or tongue.  Tremors or shakes.  Irritability.  Keep a rapid-acting carbohydrate snack available before, during, and after exercise to help prevent or treat hypoglycemia.  Avoid injecting insulin into areas of the body that are going to be exercised. For example, avoid injecting insulin into:  The arms, when playing tennis.  The legs, when jogging.  Keep records of your exercise habits. Doing this can help you and your health care provider adjust your diabetes management plan as needed. Write down:  Food that you eat before and after you exercise.  Blood glucose levels before and after you exercise.  The type and amount of exercise you have done.  When your insulin is expected to peak, if you use insulin. Avoid exercising at times when your insulin is peaking.  When you start a new exercise or activity, work with your health care provider to make sure the activity is safe for you, and to adjust your insulin,  medicines, or food intake as needed.  Drink plenty of water while you exercise to prevent dehydration or heat stroke. Drink enough fluid to keep your urine clear or pale yellow. This information is not intended to replace advice given to you by your health care provider. Make sure you discuss any questions you have with your health care provider. Document Released: 10/20/2003 Document Revised: 02/17/2016 Document Reviewed: 01/09/2016 Elsevier Interactive Patient Education  2017 [employer].

## 2016-12-12 NOTE — Progress Notes (Signed)
Patient ID: Katrina [name], female    DOB: 1970-08-26, 46 y.o.   MRN: [id number]  PCP: Joaquin Courts, FNP  Chief Complaint  Patient presents with  . Follow-up    blood sugar    Subjective:  HPI  Katrina [name] is a 46 y.o. female presents for evaluation of blood sugars.  Current problems: T2DM, Hypertension, Dyslipidemia,   Concern that blood sugars have recently been elevated during the evening time ranging in 200-240. Morning fasting readings have been consistently between 140-160's. In the evening, she has been checking readings around mealtime readings have been elevated. Denies any associated urinary ufrequency, increased thirst, or fatigue. Reports compliance with metformin 1000 mg twice daily and Onglyza 5 mg daily. Very concerned that her blood sugar is not managed. She admits to continuing to eat foods high in fat and carbohydrates. She reports no routine physical activity.  Social History   Social History  . Marital status: Married    Spouse name: N/A  . Number of children: N/A  . Years of education: N/A   Occupational History  . Not on file.   Social History Main Topics  . Smoking status: Never Smoker  . Smokeless tobacco: Never Used  . Alcohol use No  . Drug use: No  . Sexual activity: Yes    Birth control/ protection: Condom   Other Topics Concern  . Not on file   Social History Narrative   ** Merged History Encounter **        Family History  Problem Relation Age of Onset  . Diabetes Mother   . Diabetes Father   . Diabetes Sister   . Diabetes Brother    Review of Systems See HPI Patient Active Problem List   Diagnosis Date Noted  . Chest pain 12/01/2016  . Essential hypertension 12/01/2016  . Dyslipidemia 10/04/2014  . Diabetes mellitus type 2, uncontrolled (HCC) 10/04/2014  . Hyperglycemia 10/01/2014  . Other fatigue 10/01/2014  . Peri-menopause 10/01/2014  . Language barrier to communication 10/01/2014    No Known Allergies  Prior  to Admission medications   Medication Sig Start Date End Date Taking? Authorizing Provider  aspirin EC 81 MG EC tablet Take 1 tablet (81 mg total) by mouth daily. 12/02/16  Yes Pearson Grippe, MD  lisinopril (PRINIVIL,ZESTRIL) 5 MG tablet Take 0.5 tablets (2.5 mg total) by mouth daily. 11/13/16  Yes Bing Neighbors, FNP  metFORMIN (GLUCOPHAGE) 500 MG tablet Take 2 tablets (1,000 mg total) by mouth 2 (two) times daily with a meal. 11/13/16  Yes Bing Neighbors, FNP  pravastatin (PRAVACHOL) 40 MG tablet Take 1 tablet (40 mg total) by mouth daily. 11/13/16  Yes Bing Neighbors, FNP  saxagliptin HCl (ONGLYZA) 5 MG TABS tablet Take 1 tablet (5 mg total) by mouth daily. 11/13/16  Yes Bing Neighbors, FNP    Past Medical, Surgical Family and Social History reviewed and updated.    Objective:   Today's Vitals   12/12/16 0917  BP: 114/78  Pulse: 60  Resp: 18  Temp: 97.6 F (36.4 C)  TempSrc: Oral  SpO2: 100%  Weight: 171 lb (77.6 kg)  Height:  (1.651 m)    Wt Readings from Last 3 Encounters:  12/12/16 171 lb (77.6 kg)  12/01/16 175 lb (79.4 kg)  11/13/16 174 lb (78.9 kg)   Physical Exam  Constitutional: She is oriented to person, place, and time. She appears well-developed and well-nourished.  HENT:  Head: Normocephalic and atraumatic.  Eyes:  Conjunctivae and EOM are normal. Pupils are equal, round, and reactive to light.  Neck: Normal range of motion. Neck supple. No thyromegaly present.  Cardiovascular: Normal rate, regular rhythm, normal heart sounds and intact distal pulses.   Pulmonary/Chest: Effort normal and breath sounds normal.  Musculoskeletal: Normal range of motion.  Neurological: She is alert and oriented to person, place, and time.  Skin: Skin is warm and dry.  Psychiatric: She has a normal mood and affect. Her behavior is normal. Thought content normal.      Assessment & Plan:  1. Type 2 diabetes mellitus without complication, without long-term current use of  insulin (HCC), uncontrolled -Last A1C 11/2016-8.7 -Blood sugar today 163 non fasting Educated about dietary recommendations that will improve glycemic control an.d incorporate physical activity Added glipizide 5 mg once daily with breakfast  RTC: 2 month for diabetes follow-up   Godfrey Pick. Tiburcio Pea, MSN, [facility] [address] [place], [place] [address] [phone]

## 2016-12-28 ENCOUNTER — Ambulatory Visit (INDEPENDENT_AMBULATORY_CARE_PROVIDER_SITE_OTHER): Payer: Self-pay | Admitting: Family Medicine

## 2016-12-28 ENCOUNTER — Encounter: Payer: Self-pay | Admitting: Family Medicine

## 2016-12-28 VITALS — BP 114/66 | HR 61 | Temp 98.0°F | Resp 14 | Ht 65.0 in | Wt 176.0 lb

## 2016-12-28 DIAGNOSIS — L309 Dermatitis, unspecified: Secondary | ICD-10-CM

## 2016-12-28 MED ORDER — PREDNISONE 20 MG PO TABS: 40.0000 mg | ORAL_TABLET | Freq: Every day | ORAL | 0 refills | Status: AC

## 2016-12-28 MED ORDER — HYDROXYZINE HCL 25 MG PO TABS: 12.5000 mg | ORAL_TABLET | Freq: Three times a day (TID) | ORAL | 0 refills | Status: AC | PRN

## 2016-12-28 MED ORDER — BETAMETHASONE DIPROPIONATE 0.05 % EX CREA: TOPICAL_CREAM | Freq: Two times a day (BID) | CUTANEOUS | 1 refills | Status: DC

## 2016-12-28 MED ORDER — DEXAMETHASONE SODIUM PHOSPHATE 10 MG/ML IJ SOLN
10.0000 mg | Freq: Once | INTRAMUSCULAR | Status: AC
  Administered 2016-12-28: 10 mg via INTRAMUSCULAR

## 2016-12-28 MED ORDER — HYDROXYZINE HCL 25 MG PO TABS: 12.5000 mg | ORAL_TABLET | Freq: Three times a day (TID) | ORAL | 0 refills | Status: DC | PRN

## 2016-12-28 MED ORDER — PREDNISONE 20 MG PO TABS: 40.0000 mg | ORAL_TABLET | Freq: Every day | ORAL | 0 refills | Status: DC

## 2016-12-28 NOTE — Progress Notes (Signed)
Patient ID: Katrina [name], female    DOB: June 07, 1971, 46 y.o.   MRN: [id number]  PCP: Bing Neighbors, FNP  Chief Complaint  Patient presents with  . Rash    Hands and arm    Subjective:  HPI Katrina [name] is a 46 y.o. female presents for evaluation of an acute rash bilateral arms and hands x 2 weeks. 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 Uncertain, although she feels that the rash developed after working with drywall one day.  Katrina [name] reports development of a similar rash to her hands sometimes ago, that resolved without medication interventions. Reports rash is presently itching and burning. Likely made worst by her persistent scratching to the point that she has broken the skin. She has attempted relief of itching with over the counter anti-itch cream, without improvement. Denies any associated fever, chills, or skin eruptions on any other portion of her body.  Social History   Social History  . Marital status: Married    Spouse name: N/A  . Number of children: N/A  . Years of education: N/A   Occupational History  . Not on file.   Social History Main Topics  . Smoking status: Never Smoker  . Smokeless tobacco: Never Used  . Alcohol use No  . Drug use: No  . Sexual activity: Yes    Birth control/ protection: Condom   Other Topics Concern  . Not on file   Social History Narrative   ** Merged History Encounter **        Family History  Problem Relation Age of Onset  . Diabetes Mother   . Diabetes Father   . Diabetes Sister   . Diabetes Brother    Review of Systems  See HPI  Patient Active Problem List   Diagnosis Date Noted  . Chest pain 12/01/2016  . Essential hypertension 12/01/2016  . Dyslipidemia 10/04/2014  . Diabetes mellitus type 2, uncontrolled (HCC) 10/04/2014  . Hyperglycemia 10/01/2014  . Other fatigue 10/01/2014  . Peri-menopause 10/01/2014  . Language barrier to communication 10/01/2014    No  Known Allergies  Prior to Admission medications   Medication Sig Start Date End Date Taking? Authorizing Provider  aspirin EC 81 MG EC tablet Take 1 tablet (81 mg total) by mouth daily. 12/02/16  Yes Pearson Grippe, MD  glipiZIDE (GLUCOTROL) 5 MG tablet Take 1 tablet (5 mg total) by mouth daily before breakfast. 12/12/16  Yes Bing Neighbors, FNP  lisinopril (PRINIVIL,ZESTRIL) 5 MG tablet Take 0.5 tablets (2.5 mg total) by mouth daily. 11/13/16  Yes Bing Neighbors, FNP  metFORMIN (GLUCOPHAGE) 500 MG tablet Take 2 tablets (1,000 mg total) by mouth 2 (two) times daily with a meal. 11/13/16  Yes Bing Neighbors, FNP  pravastatin (PRAVACHOL) 40 MG tablet Take 1 tablet (40 mg total) by mouth daily. 11/13/16  Yes Bing Neighbors, FNP  saxagliptin HCl (ONGLYZA) 5 MG TABS tablet Take 1 tablet (5 mg total) by mouth daily. 12/12/16  Yes Bing Neighbors, FNP   Past Medical, Surgical Family and Social History reviewed and updated.  Objective:   Today's Vitals   12/28/16 1442  BP: 114/66  Pulse: 61  Resp: 14  Temp: 98 F (36.7 C)  TempSrc: Oral  SpO2: 99%  Weight: 176 lb (79.8 kg)  Height: 5\' 5"  (1.651 m)    Wt Readings from Last 3 Encounters:  12/28/16 176 lb (79.8 kg)  12/12/16 171 lb (77.6  kg)  12/01/16 175 lb (79.4 kg)    Physical Exam  Constitutional: She is oriented to person, place, and time. She appears well-developed and well-nourished.  HENT:  Head: Normocephalic and atraumatic.  Right Ear: External ear normal.  Left Ear: External ear normal.  Eyes: Conjunctivae and EOM are normal. Pupils are equal, round, and reactive to light.  Neck: Normal range of motion. Neck supple.  Cardiovascular: Normal rate, regular rhythm and normal heart sounds.   Pulmonary/Chest: Effort normal and breath sounds normal.  Musculoskeletal: Normal range of motion.  Neurological: She is alert and oriented to person, place, and time.  Skin: Skin is warm. Rash noted. There is erythema.  Non-pustular  maculopapular rash posterior hands and forearm. Hands are excoriated   Psychiatric: She has a normal mood and affect. Her behavior is normal. Thought content normal.   Assessment & Plan:  1. Dermatitis - dexamethasone (DECADRON) injection 10 mg; Inject 1 mL (10 mg total) into the muscle once, in office. -Tomorrow start Prednisone 40 mg once daily with breakfast x 5 days -For acute itching, take hydroxyzine 12.5-25 mg up to 3 times daily. -Apply betamethasone cream to affected areas twice daily x 14 days   Godfrey PickKimberly S. Tiburcio PeaHarris, MSN, FNP-C [facility]  [address] [place], [place] [address] [phone]

## 2016-12-28 NOTE — Patient Instructions (Addendum)
You have contact dermatitis.  Start taking prednisone 40 mg once daily with breakfast for 5 days for rash.  Apply cream, "betamethosone"  to rash twice weekly   For itching, take hydroxyzine 12.5-25 mg up to 3 times daily as needed for itching.   Contact Dermatitis Dermatitis is redness, soreness, and swelling (inflammation) of the skin. Contact dermatitis is a reaction to certain substances that touch the skin. You either touched something that irritated your skin, or you have allergies to something you touched. Follow these instructions at home: Skin Care   Moisturize your skin as needed.  Apply cool compresses to the affected areas.  Try taking a bath with:  Epsom salts. Follow the instructions on the package. You can get these at a pharmacy or grocery store.  Baking soda. Pour a small amount into the bath as told by your doctor.  Colloidal oatmeal. Follow the instructions on the package. You can get this at a pharmacy or grocery store.  Try applying baking soda paste to your skin. Stir water into baking soda until it looks like paste.  Do not scratch your skin.  Bathe less often.  Bathe in lukewarm water. Avoid using hot water. Medicines   Take or apply over-the-counter and prescription medicines only as told by your doctor.  If you were prescribed an antibiotic medicine, take or apply your antibiotic as told by your doctor. Do not stop taking the antibiotic even if your condition starts to get better. General instructions   Keep all follow-up visits as told by your doctor. This is important.  Avoid the substance that caused your reaction. If you do not know what caused it, keep a journal to try to track what caused it. Write down:  What you eat.  What cosmetic products you use.  What you drink.  What you wear in the affected area. This includes jewelry.  If you were given a bandage (dressing), take care of it as told by your doctor. This includes when to  change and remove it. Contact a doctor if:  You do not get better with treatment.  Your condition gets worse.  You have signs of infection such as:  Swelling.  Tenderness.  Redness.  Soreness.  Warmth.  You have a fever.  You have new symptoms. Get help right away if:  You have a very bad headache.  You have neck pain.  Your neck is stiff.  You throw up (vomit).  You feel very sleepy.  You see red streaks coming from the affected area.  Your bone or joint underneath the affected area becomes painful after the skin has healed.  The affected area turns darker.  You have trouble breathing. This information is not intended to replace advice given to you by your health care provider. Make sure you discuss any questions you have with your health care provider. Document Released: 05/27/2009 Document Revised: 01/05/2016 Document Reviewed: 12/15/2014 Elsevier Interactive Patient Education  2017 [employer]

## 2017-01-04 ENCOUNTER — Ambulatory Visit: Payer: Self-pay | Admitting: Family Medicine

## 2017-02-14 ENCOUNTER — Ambulatory Visit: Payer: Self-pay | Admitting: Family Medicine

## 2017-02-18 MED FILL — ?METFORMIN HCL 500MG TABLET: 500 | 30 days supply | Qty: 120 | Fill #1

## 2017-02-18 MED FILL — PRAVASTATIN NA 40 MG TAB: 40 | 30 days supply | Qty: 30 | Fill #4

## 2017-02-18 MED FILL — LISINOPRIL 5 MG TAB: 5 | 30 days supply | Qty: 15 | Fill #4

## 2017-04-30 MED FILL — **ONGLYZA 5 MG TABLET: 5 MG | 30 days supply | Qty: 30 | Fill #1

## 2017-04-30 MED FILL — ?METFORMIN HCL 500MG TABLET: 500 | 30 days supply | Qty: 120 | Fill #2

## 2017-04-30 MED FILL — LISINOPRIL 5 MG TAB: 5 | 30 days supply | Qty: 15 | Fill #5

## 2017-10-21 ENCOUNTER — Encounter: Payer: Self-pay | Admitting: Family Medicine

## 2017-10-21 ENCOUNTER — Ambulatory Visit (INDEPENDENT_AMBULATORY_CARE_PROVIDER_SITE_OTHER): Payer: Self-pay | Admitting: Family Medicine

## 2017-10-21 VITALS — BP 120/78 | HR 80 | Temp 98.6°F | Ht 65.0 in | Wt 171.0 lb

## 2017-10-21 DIAGNOSIS — R51 Headache: Secondary | ICD-10-CM

## 2017-10-21 DIAGNOSIS — R519 Headache, unspecified: Secondary | ICD-10-CM

## 2017-10-21 DIAGNOSIS — E119 Type 2 diabetes mellitus without complications: Secondary | ICD-10-CM

## 2017-10-21 LAB — POCT URINALYSIS DIP (DEVICE)
Bilirubin Urine: NEGATIVE
Glucose, UA: 500 mg/dL — AB
HGB URINE DIPSTICK: NEGATIVE
Ketones, ur: NEGATIVE mg/dL
Leukocytes, UA: NEGATIVE
NITRITE: NEGATIVE
PH: 5.5 (ref 5.0–8.0)
Protein, ur: NEGATIVE mg/dL
SPECIFIC GRAVITY, URINE: 1.01 (ref 1.005–1.030)
Urobilinogen, UA: 0.2 mg/dL (ref 0.0–1.0)

## 2017-10-21 LAB — POCT GLYCOSYLATED HEMOGLOBIN (HGB A1C): Hemoglobin A1C: 11.1

## 2017-10-21 LAB — GLUCOSE, POCT (MANUAL RESULT ENTRY): POC GLUCOSE: 318 mg/dL — AB (ref 70–99)

## 2017-10-21 MED ORDER — PRAVASTATIN SODIUM 40 MG PO TABS: 40.0000 mg | ORAL_TABLET | Freq: Every day | ORAL | 1 refills | Status: AC

## 2017-10-21 MED ORDER — INSULIN GLARGINE 100 UNIT/ML ~~LOC~~ SOLN: 20.0000 [IU] | Freq: Every day | SUBCUTANEOUS | 11 refills | Status: DC

## 2017-10-21 MED ORDER — "SYRINGE 27G X 1-1/4"" 3 ML MISC": 1 refills | Status: AC

## 2017-10-21 MED ORDER — LISINOPRIL 5 MG PO TABS: 2.5000 mg | ORAL_TABLET | Freq: Every day | ORAL | 1 refills | Status: AC

## 2017-10-21 MED ORDER — METFORMIN HCL 500 MG PO TABS: 1000.0000 mg | ORAL_TABLET | Freq: Two times a day (BID) | ORAL | 1 refills | Status: AC

## 2017-10-21 MED ORDER — INJECTION DEVICE FOR INSULIN DEVI
Freq: Once | Status: AC
  Administered 2017-10-21: 15:00:00

## 2017-10-21 MED ORDER — BUTALBITAL-ASA-CAFFEINE 50-325-40 MG PO CAPS: 1.0000 | ORAL_CAPSULE | Freq: Four times a day (QID) | ORAL | 0 refills | Status: AC | PRN

## 2017-10-21 MED FILL — PRAVASTATIN NA 40 MG TAB: 40 | 30 days supply | Qty: 30 | Fill #0

## 2017-10-21 MED FILL — LISINOPRIL 5 MG TAB: 5 | 30 days supply | Qty: 15 | Fill #0

## 2017-10-21 MED FILL — metFORMIN HCL 500 MG TABS: 500 | 30 days supply | Qty: 120 | Fill #0

## 2017-10-21 MED FILL — !LANTUS 100 UNITS/ML VIAL: 100 | 28 days supply | Qty: 10 | Fill #0

## 2017-10-21 NOTE — Progress Notes (Signed)
Patient ID: Katrina [name], female    DOB: Oct 20, 1970, 47 y.o.   MRN: [id number]  PCP: Bing Neighbors, FNP  Chief Complaint  Patient presents with  . Headache    Subjective:  HPI Katrina [name] is a 47 y.o. female with history of uncontrolled type 2 diabetes presents for evaluation of headache. Reports recent elevations in blood sugar with the most recent reading 399. Last A1C 8.1. She has stopped taking her diabetes medication several months ago she ran out and never returned for follow-up.  She reports having an ongoing headache and just feeling overall generalized fatigue over the course of the last few weeks.  She also reports associated decreased vision along with some neuropathic pain in her distal digits.  She denies chest pain, shortness of breath, and or dizziness.  She continues to be an active of routine physical activity. Social History   Socioeconomic History  . Marital status: Married    Spouse name: Not on file  . Number of children: Not on file  . Years of education: Not on file  . Highest education level: Not on file  Social Needs  . Financial resource strain: Not on file  . Food insecurity - worry: Not on file  . Food insecurity - inability: Not on file  . Transportation needs - medical: Not on file  . Transportation needs - non-medical: Not on file  Occupational History  . Not on file  Tobacco Use  . Smoking status: Never Smoker  . Smokeless tobacco: Never Used  Substance and Sexual Activity  . Alcohol use: No  . Drug use: No  . Sexual activity: Yes    Birth control/protection: Condom  Other Topics Concern  . Not on file  Social History Narrative   ** Merged History Encounter **        Family History  Problem Relation Age of Onset  . Diabetes Mother   . Diabetes Father   . Diabetes Sister   . Diabetes Brother    Review of Systems Pertinent negatives indicated in HPI Patient Active Problem List   Diagnosis Date Noted  . Chest pain 12/01/2016   . Essential hypertension 12/01/2016  . Dyslipidemia 10/04/2014  . Diabetes mellitus type 2, uncontrolled (HCC) 10/04/2014  . Hyperglycemia 10/01/2014  . Other fatigue 10/01/2014  . Peri-menopause 10/01/2014  . Language barrier to communication 10/01/2014    No Known Allergies  Prior to Admission medications   Medication Sig Start Date End Date Taking? Authorizing Provider  lisinopril (PRINIVIL,ZESTRIL) 5 MG tablet Take 0.5 tablets (2.5 mg total) by mouth daily. 11/13/16  Yes Bing Neighbors, FNP  metFORMIN (GLUCOPHAGE) 500 MG tablet Take 2 tablets (1,000 mg total) by mouth 2 (two) times daily with a meal. 11/13/16  Yes Bing Neighbors, FNP  pravastatin (PRAVACHOL) 40 MG tablet Take 1 tablet (40 mg total) by mouth daily. 11/13/16  Yes Bing Neighbors, FNP  aspirin EC 81 MG EC tablet Take 1 tablet (81 mg total) by mouth daily. 12/02/16   Pearson Grippe, MD  betamethasone dipropionate (DIPROLENE) 0.05 % cream Apply topically 2 (two) times daily. 12/28/16   Bing Neighbors, FNP  glipiZIDE (GLUCOTROL) 5 MG tablet Take 1 tablet (5 mg total) by mouth daily before breakfast. 12/12/16   Bing Neighbors, FNP  hydrOXYzine (ATARAX/VISTARIL) 25 MG tablet Take 0.5-1 tablets (12.5-25 mg total) by mouth every 8 (eight) hours as needed for itching. 12/28/16   Bing Neighbors, FNP  saxagliptin HCl (ONGLYZA)  5 MG TABS tablet Take 1 tablet (5 mg total) by mouth daily. 12/12/16   Bing Neighbors, FNP    Past Medical, Surgical Family and Social History reviewed and updated.    Objective:   Today's Vitals   10/21/17 1423  BP: 120/78  Pulse: 80  Temp: 98.6 F (37 C)  TempSrc: Oral  SpO2: 100%  Weight: 171 lb (77.6 kg)  Height: 5\' 5"  (1.651 m)    Wt Readings from Last 3 Encounters:  10/21/17 171 lb (77.6 kg)  12/28/16 176 lb (79.8 kg)  12/12/16 171 lb (77.6 kg)    Physical Exam Constitutional: Patient appears well-developed and well-nourished. No distress. HENT: Normocephalic,  atraumatic, External right and left ear normal. Oropharynx is clear and moist.  Eyes: Conjunctivae and EOM are normal. PERRLA, no scleral icterus. Neck: Normal ROM. Neck supple. No JVD. No tracheal deviation. No thyromegaly. CVS: RRR, S1/S2 +, no murmurs, no gallops, no carotid bruit.  Pulmonary: Effort and breath sounds normal, no stridor, rhonchi, wheezes, rales.  Abdominal: Soft. BS +, no distension, tenderness, rebound or guarding.  Musculoskeletal: Normal range of motion. No edema and no tenderness.  Neuro: Alert. Normal reflexes, muscle tone coordination.  Skin: Skin is warm and dry. No rash noted. Not diaphoretic. No erythema. No pallor. Psychiatric: Normal mood and affect. Behavior, judgment, thought content normal.   Assessment & Plan:  1. Type 2 diabetes mellitus without complication, without long-term current use of insulin (HCC) A1c today is 11.1.  Counseled patient on the necessity of adding Lantus to medication regimen in order to improve glycemic control.  We will start her on Lantus at bedtime 10 units with a high protein snack.  Resume metformin 1000 mg twice daily.  Encourage strict dietary control and management of foods rich in starches and sweetened beverages should be discontinued and substituted for water or unsweetened beverages.  Encouraged physical activity with a goal of 150 minutes/week.  Patient will return in 2 weeks for further evaluation of hyperglycemia and to evaluate for medication compliance.  2. Headache, generalized likely secondary to uncontrolled will trial Fioricet as needed every 4 hours for headache pain.  Encouraged hydration with water as headache is probably precipitated by dehydration.  Meds ordered this encounter  Medications  . injection device for insulin  . pravastatin (PRAVACHOL) 40 MG tablet    Sig: Take 1 tablet (40 mg total) by mouth daily.    Dispense:  90 tablet    Refill:  1    Order Specific Question:   Supervising Provider    Answer:    Quentin Angst [id number]  . metFORMIN (GLUCOPHAGE) 500 MG tablet    Sig: Take 2 tablets (1,000 mg total) by mouth 2 (two) times daily with a meal.    Dispense:  180 tablet    Refill:  1    Order Specific Question:   Supervising Provider    Answer:   Quentin Angst [id number]  . lisinopril (PRINIVIL,ZESTRIL) 5 MG tablet    Sig: Take 0.5 tablets (2.5 mg total) by mouth daily.    Dispense:  90 tablet    Refill:  1    Order Specific Question:   Supervising Provider    Answer:   Quentin Angst [id number]  . insulin glargine (LANTUS) 100 UNIT/ML injection    Sig: Inject 0.2 mLs (20 Units total) into the skin at bedtime.    Dispense:  10 mL    Refill:  11    Order  Specific Question:   Supervising Provider    Answer:   Quentin AngstJEGEDE, OLUGBEMIGA E [id number]]  . butalbital-aspirin-caffeine (FIORINAL) 50-325-40 MG capsule    Sig: Take 1 capsule by mouth every 6 (six) hours as needed for headache.    Dispense:  20 capsule    Refill:  0    Order Specific Question:   Supervising Provider    Answer:   Quentin AngstJEGEDE, OLUGBEMIGA E [id number]]  . Syringe/Needle, Disp, (SYRINGE 3CC/27GX1-1/4") 27G X 1-1/4" 3 ML MISC    Sig: Use 1 syringe to inject subcutaneus dose of insulin PRN    Dispense:  300 each    Refill:  1    Order Specific Question:   Supervising Provider    Answer:   Quentin AngstJEGEDE, OLUGBEMIGA E [[id number]]      [id number]]      Graclynn Vanantwerp S. Tiburcio PeaHarris, MSN, FNP-C [facility]  [address] [place], [place] [address] [phone]

## 2017-10-21 NOTE — Patient Instructions (Signed)
Compruebe el azcar en la [employer] al da, [place] y antes de [place]. Mantenga un registro de las lecturas. -Estoy comenzando con Lantus 20 unidades a la hora de [employer] abdomen y / o en los muslos con un refrigerio alto en protenas. Rotar los sitios de inyeccin de insulina. -Continuar metformina 1000 mg [employer]. -Resume pravastatina y lisinopril.  Regreso en 2 semanas para diabetes. Seguimiento.     -Check blood sugar twice daily-morning and bedtime. Keep a log of readings. -I am starting you Lantus 20 units at bedtime into abdomen and or thighs  with a high protein snack. Rotate insulin injection sites. -Continue metformin 1000 mg twice daily. -Resume pravastatin and lisinopril   Return in 2 weeks for diabetes  Follow-up.    Diabetes Mellitus and Nutrition When you have diabetes (diabetes mellitus), it is very important to have healthy eating habits because your blood sugar (glucose) levels are greatly affected by what you eat and drink. Eating healthy foods in the appropriate amounts, at about the same times every day, can help you:  Control your blood glucose.  Lower your risk of heart disease.  Improve your blood pressure.  Reach or maintain a healthy weight.  Every person with diabetes is different, and each person has different needs for a meal plan. Your health care provider may recommend that you work with a diet and nutrition specialist (dietitian) to make a meal plan that is best for you. Your meal plan may vary depending on factors such as:  The calories you need.  The medicines you take.  Your weight.  Your blood glucose, blood pressure, and cholesterol levels.  Your activity level.  Other health conditions you have, such as heart or kidney disease.  How do carbohydrates affect me? Carbohydrates affect your blood glucose level more than any other type of food. Eating carbohydrates naturally increases the amount of glucose in your  blood. Carbohydrate counting is a method for keeping track of how many carbohydrates you eat. Counting carbohydrates is important to keep your blood glucose at a healthy level, especially if you use insulin or take certain oral diabetes medicines. It is important to know how many carbohydrates you can safely have in each meal. This is different for every person. Your dietitian can help you calculate how many carbohydrates you should have at each meal and for snack. Foods that contain carbohydrates include:  Bread, cereal, rice, pasta, and crackers.  Potatoes and corn.  Peas, beans, and lentils.  Milk and yogurt.  Fruit and juice.  Desserts, such as cakes, cookies, ice cream, and candy.  How does alcohol affect me? Alcohol can cause a sudden decrease in blood glucose (hypoglycemia), especially if you use insulin or take certain oral diabetes medicines. Hypoglycemia can be a life-threatening condition. Symptoms of hypoglycemia (sleepiness, dizziness, and confusion) are similar to symptoms of having too much alcohol. If your health care provider says that alcohol is safe for you, follow these guidelines:  Limit alcohol intake to no more than 1 drink per day for nonpregnant women and 2 drinks per day for men. One drink equals 12 oz of beer, 5 oz of wine, or 1 oz of hard liquor.  Do not drink on an empty stomach.  Keep yourself hydrated with water, diet soda, or unsweetened iced tea.  Keep in mind that regular soda, juice, and other mixers may contain a lot of sugar and must be counted as carbohydrates.  What are tips  for following this plan? Reading food labels  Start by checking the serving size on the label. The amount of calories, carbohydrates, fats, and other nutrients listed on the label are based on one serving of the food. Many foods contain more than one serving per package.  Check the total grams (g) of carbohydrates in one serving. You can calculate the number of servings of  carbohydrates in one serving by dividing the total carbohydrates by 15. For example, if a food has 30 g of total carbohydrates, it would be equal to 2 servings of carbohydrates.  Check the number of grams (g) of saturated and trans fats in one serving. Choose foods that have low or no amount of these fats.  Check the number of milligrams (mg) of sodium in one serving. Most people should limit total sodium intake to less than 2,300 mg per day.  Always check the nutrition information of foods labeled as "low-fat" or "nonfat". These foods may be higher in added sugar or refined carbohydrates and should be avoided.  Talk to your dietitian to identify your daily goals for nutrients listed on the label. Shopping  Avoid buying canned, premade, or processed foods. These foods tend to be high in fat, sodium, and added sugar.  Shop around the outside edge of the grocery store. This includes fresh fruits and vegetables, bulk grains, fresh meats, and fresh dairy. Cooking  Use low-heat cooking methods, such as baking, instead of high-heat cooking methods like deep frying.  Cook using healthy oils, such as olive, canola, or sunflower oil.  Avoid cooking with butter, cream, or high-fat meats. Meal planning  Eat meals and snacks regularly, preferably at the same times every day. Avoid going long periods of time without eating.  Eat foods high in fiber, such as fresh fruits, vegetables, beans, and whole grains. Talk to your dietitian about how many servings of carbohydrates you can eat at each meal.  Eat 4-6 ounces of lean protein each day, such as lean meat, chicken, fish, eggs, or tofu. 1 ounce is equal to 1 ounce of meat, chicken, or fish, 1 egg, or 1/4 cup of tofu.  Eat some foods each day that contain healthy fats, such as avocado, nuts, seeds, and fish. Lifestyle   Check your blood glucose regularly.  Exercise at least 30 minutes 5 or more days each week, or as told by your health care  provider.  Take medicines as told by your health care provider.  Do not use any products that contain nicotine or tobacco, such as cigarettes and e-cigarettes. If you need help quitting, ask your health care provider.  Work with a Veterinary surgeon or diabetes educator to identify strategies to manage stress and any emotional and social challenges. What are some questions to ask my health care provider?  Do I need to meet with a diabetes educator?  Do I need to meet with a dietitian?  What number can I call if I have questions?  When are the best times to check my blood glucose? Where to find more information:  American Diabetes Association: diabetes.org/food-and-fitness/food  Academy of Nutrition and Dietetics: https://www.vargas.com/  [employer] of Diabetes and Digestive and Kidney Diseases (NIH): FindJewelers.cz Summary  A healthy meal plan will help you control your blood glucose and maintain a healthy lifestyle.  Working with a diet and nutrition specialist (dietitian) can help you make a meal plan that is best for you.  Keep in mind that carbohydrates and alcohol have immediate effects on your  blood glucose levels. It is important to count carbohydrates and to use alcohol carefully. This information is not intended to replace advice given to you by your health care provider. Make sure you discuss any questions you have with your health care provider. Document Released: 04/26/2005 Document Revised: 09/03/2016 Document Reviewed: 09/03/2016 Elsevier Interactive Patient Education  [employer]2018 [employer] Inc.

## 2017-10-22 ENCOUNTER — Encounter: Payer: Self-pay | Admitting: Family Medicine

## 2017-10-22 LAB — COMPREHENSIVE METABOLIC PANEL
A/G RATIO: 1.4 (ref 1.2–2.2)
ALBUMIN: 4.2 g/dL (ref 3.5–5.5)
ALT: 16 IU/L (ref 0–32)
AST: 13 IU/L (ref 0–40)
Alkaline Phosphatase: 111 IU/L (ref 39–117)
BUN/Creatinine Ratio: 26 — ABNORMAL HIGH (ref 9–23)
BUN: 16 mg/dL (ref 6–24)
Bilirubin Total: <0.2 mg/dL (ref 0.0–1.2)
CALCIUM: 9.3 mg/dL (ref 8.7–10.2)
CO2: 23 mmol/L (ref 20–29)
Chloride: 97 mmol/L (ref 96–106)
Creatinine, Ser: 0.61 mg/dL (ref 0.57–1.00)
GFR calc non Af Amer: 109 mL/min/{1.73_m2} (ref >59)
GFR, EST AFRICAN AMERICAN: 126 mL/min/{1.73_m2} (ref >59)
GLUCOSE: 292 mg/dL — AB (ref 65–99)
Globulin, Total: 2.9 g/dL (ref 1.5–4.5)
Potassium: 4.3 mmol/L (ref 3.5–5.2)
Sodium: 136 mmol/L (ref 134–144)
TOTAL PROTEIN: 7.1 g/dL (ref 6.0–8.5)

## 2017-10-22 NOTE — Progress Notes (Signed)
Mail lab letter  

## 2017-11-04 ENCOUNTER — Encounter: Payer: Self-pay | Admitting: Family Medicine

## 2017-11-04 ENCOUNTER — Ambulatory Visit (INDEPENDENT_AMBULATORY_CARE_PROVIDER_SITE_OTHER): Payer: Self-pay | Admitting: Family Medicine

## 2017-11-04 VITALS — BP 118/72 | HR 80 | Temp 98.6°F | Ht 65.0 in | Wt 170.2 lb

## 2017-11-04 DIAGNOSIS — E119 Type 2 diabetes mellitus without complications: Secondary | ICD-10-CM

## 2017-11-04 DIAGNOSIS — H6692 Otitis media, unspecified, left ear: Secondary | ICD-10-CM

## 2017-11-04 LAB — POCT URINALYSIS DIP (DEVICE)
Bilirubin Urine: NEGATIVE
Glucose, UA: 500 mg/dL — AB
HGB URINE DIPSTICK: NEGATIVE
LEUKOCYTES UA: NEGATIVE
NITRITE: NEGATIVE
PH: 5 (ref 5.0–8.0)
Protein, ur: NEGATIVE mg/dL
Urobilinogen, UA: 0.2 mg/dL (ref 0.0–1.0)

## 2017-11-04 LAB — GLUCOSE, POCT (MANUAL RESULT ENTRY): POC Glucose: 218 mg/dl — AB (ref 70–99)

## 2017-11-04 LAB — POCT GLYCOSYLATED HEMOGLOBIN (HGB A1C): Hemoglobin A1C: 11.2

## 2017-11-04 MED ORDER — INSULIN GLARGINE 100 UNIT/ML ~~LOC~~ SOLN: 30.0000 [IU] | Freq: Every day | SUBCUTANEOUS | 11 refills | Status: AC

## 2017-11-04 MED ORDER — GLIPIZIDE 5 MG PO TABS: 5.0000 mg | ORAL_TABLET | Freq: Every day | ORAL | 1 refills | Status: AC

## 2017-11-04 MED ORDER — AMOXICILLIN-POT CLAVULANATE 875-125 MG PO TABS: 1.0000 | ORAL_TABLET | Freq: Two times a day (BID) | ORAL | 0 refills | Status: DC

## 2017-11-04 MED FILL — glipiZIDE 5 MG TABS: 5 | 30 days supply | Qty: 30 | Fill #0

## 2017-11-04 MED FILL — AMOX-CLAV 875-125 MG TABLET: 875-125 | 10 days supply | Qty: 20 | Fill #0

## 2017-11-04 NOTE — Patient Instructions (Addendum)
Start Augmentin 875-125 mg twice daily x 10 days   I have increased your Lantus 30 units once daily at bedtime.   I am adding Glipizide 5 mg once daily with breakfast to your regimen to help improve blood sugars.    O diabetes mellitus e a nutrio Diabetes Mellitus and Nutrition Quando voc sofre de diabetes (diabetes mellitus),  muito importante ter hbitos de alimentao saudveis, uma vez que os seus nveis de acar no sangue (glicose) so significativamente afetados pelo que voc come e bebe. Comer alimentos saudveis nas quantidades apropriadas e aproximadamente nos mesmos horrios todos os dias pode ajudar voc a:  Controlar seu nvel de glicose sangunea.  Reduzir seu risco de doena cardaca.  Melhorar sua presso arterial.  Alcanar ou manter um peso saudvel.  Todas as pessoas com diabetes so diferentes, e cada pessoa tem diferentes necessidades em termos de um plano de refeies. Seu mdico poder recomendar que voc colabore com um especialista em nutrio e dieta (nutricionista) para elaborar o melhor plano de refeies para voc. Seu plano de refeies poder variar dependendo de fatores como:  As calorias de que voc necessita.  Os medicamentos que toma.  Seu peso.  Seus nveis de glicose sangunea, presso arterial e colesterol.  Seu nvel de atividade.  Outros quadros clnicos, como doena cardaca ou renal.  Como os carboidratos me afetam? Os carboidratos afetam o nvel de sua glicose sangunea mais do que qualquer outro tipo de [place]. A ingesto de carboidratos naturalmente aumenta a quantidade de glicose no sangue. A contagem de carboidratos  um mtodo para acompanhar a quantidade de carboidratos ingerida por voc. A contagem de carboidratos  importante para manter sua glicose sangunea em um nvel saudvel, especialmente se voc usar insulina ou tomar certos medicamentos orais para o diabetes mellitus.  importante saber quantos carboidratos voc  pode ingerir com segurana em cada refeio. Isso varia de pessoa para pessoa. Seu nutricionista poder ajudar voc a calcular quantos carboidratos voc precisa obter em cada refeio e como lanche. Alimentos que contm carboidratos incluem:  Po, cereais, arroz, massas e bolachas.  Batatas e milho.  Ervilhas, feijo e lentilhas.  Leite e iogurte.  Frutas e sucos.  Sobremesas, como bolos, biscoitos, sorvete e doces.  Como o lcool me afeta? O lcool pode causar uma sbita reduo do nvel de glicose sangunea (hipoglicemia), especialmente se voc usar insulina ou tomar certos medicamentos orais para o diabetes. A hipoglicemia pode ser um quadro clnico potencialmente fatal. Os sintomas de hipoglicemia (sonolncia, vertigem e confuso) so parecidos com os sintomas da ingesto abusiva de lcool. Caso seu mdico diga que o lcool  seguro para voc, siga essas orientaes:  Limite o consumo de bebidas alcolicas a, no mximo, 1 dose por dia para mulheres no grvidas e 2 doses por dia para homens. Uma dose equivale a 12 onas de cerveja, 5 onas de vinho ou 1 ona de bebida destilada.  No beba de estmago vazio.  Mantenha sua hidratao com gua, refrigerante diet ou ch gelado sem acar.  Tenha em mente que o refrigerante normal, suco e outras misturas podem conter [employer] acar e devem ser contadas como carboidratos.  Quais so as dicas para seguir este plano? Leia os rtulos de alimentos  Comece verificando o Musician poro no rtulo. A quantidade de calorias, carboidratos, gorduras e outros nutrientes listados no rtulo se baseia em uma poro do alimento. Muitos alimentos contm mais de uma poro por embalagem.  Verifique o total de gramas (g) de carboidratos  em uma poro. Voc pode calcular o nmero de pores de carboidratos em uma poro dividindo o total de carboidratos por 15. Por exemplo: supondo que um alimento contenha no total 30 g de carboidratos, isso seria igual  a 2 pores de carboidratos.  Verifique o nmero de gramas (g) de gorduras saturadas e trans em uma poro. Escolha alimentos com pouca ou nenhuma quantidade dessas gorduras.  Verifique o nmero de miligramas (mg) de sdio em uma poro. A maioria das pessoas deve limitar o consumo de sdio a 2.300 mg por dia.  Sempre verifique as informaes nutricionais dos alimentos rotulados como de "baixo teor de gordura" e "gordura zero". Esses alimentos podem conter elevado teor de acar ou carboidratos refinados e devem ser evitados.  Converse com seu nutricionista para identificar suas metas dirias dos nutrientes listados na tabela. No mercado  Evite comprar alimentos enlatados, semiprontos ou processados. Esses alimentos tendem a conter elevado teor de gordura, sdio e acares adicionados.  Escolha itens das reas mais externas da seo de alimentos. Ela inclui frutas e verduras frescas, cereais a granel, carnes frescas e produtos avirios frescos. Na cozinha  Use mtodos de cozimento de baixo calor, como assar, em vez de mtodos de cozimento de alto calor, como fritura por imerso.  Cozinhe com leos saudveis para o corao, como de Tiptonoliva, canola ou IrvinMusiciangtongirassol.  Evite cozinhar com manteiga, creme ou carnes com elevado teor de Montserratgordura. O planejamento das refeies  Consuma refeies e lanches regularmente, de preferncia nos mesmos horrios todos os dias. Evite ficar longos perodos sem comer.  Coma alimentos ricos em Leuppfibra, como frutas e verduras frescas, feijo e gros integrais. Converse com seu nutricionista sobre quantas pores de carboidratos voc pode comer em cada refeio.  Coma 4-6 onas de protena magra por dia, como carne magra, frango, peixe, ovos ou tofu. 1 ona  igual a 1 ona de carne, frango ou peixe, 1 ovo ou 1/4 de xcara de tofu.  Coma alguns alimentos todos os dias que contenham gorduras saudveis, como abacate, nozes, sementes e peixe. Estilo de vida   Verifique  sua glicose sangunea regularmente.  Exercite-se por pelo menos 30 minutos em 5 dias da semana ou mais ou pelo tempo determinado pelo seu mdico.  Tome medicamentos somente de acordo com as orientaes do seu mdico.  No use nenhum produto que contenha nicotina ou tabaco, como cigarros e cigarros eletrnicos. Caso precise de ajuda para parar de fumar, fale com seu mdico.  Consulte-se com um especialista em diabetes para identificar estratgias para lidar com o estresse e quaisquer desafios emocionais ou sociais. Quais so Calpine Corporationalgumas das perguntas para fazer ao meu mdico?  Preciso me consultar com um especialista em diabetes?  Preciso me consultar com um nutricionista?  Para que nmero devo ligar se tiver dvidas?  Quais so os melhores horrios para verificar minha glicose sangunea? Onde conseguir mais informaes:  American Diabetes Association (Associao Americana do Diabetes): diabetes.org/food-and-fitness/food  Academy of Nutrition and Dietetics (Academia de Nutrio e Dieta): https://www.vargas.com/www.eatright.org/resources/health/diseases-and-conditions/diabetes  [employer] Institute of Diabetes and Digestive and Kidney Diseases (NIH) [employer](Instituto Nacional de Diabetes e Doenas Digestivas e Renais): FindJewelers.czwww.niddk.nih.gov/health-information/diabetes/overview/diet-eating-physical-activity Resumo  Um plano de refeies saudveis ajudar voc a controlar sua glicose sangunea e a manter um estilo de vida saudvel.  Consultar um especialista em nutrio (nutricionista) poder ajudar na elaborao do melhor plano para voc.  Tenha em mente que carboidratos e lcool tm efeitos imediatos sobre seus nveis de glicose sangunea.  importante contar os carboidratos e consumir lcool com  cuidado. Estas informaes no se destinam a substituir as recomendaes de seu mdico. No deixe de discutir quaisquer dvidas com seu mdico. Document Released: 11/21/2015 Document Revised: 11/29/2016 Document Reviewed:  11/29/2016 Elsevier Interactive Patient Education  2018 Elsevier Inc.   Otitis Media, Adult Otitis media is redness, soreness, and puffiness (swelling) in the space just behind your eardrum (middle ear). It may be caused by allergies or infection. It often happens along with a cold. Follow these instructions at home:  Take your medicine as told. Finish it even if you start to feel better.  Only take over-the-counter or prescription medicines for pain, discomfort, or fever as told by your doctor.  Follow up with your doctor as told. Contact a doctor if:  You have otitis media only in one ear, or bleeding from your nose, or both.  You notice a lump on your neck.  You are not getting better in 3-5 days.  You feel worse instead of better. Get help right away if:  You have pain that is not helped with medicine.  You have puffiness, redness, or pain around your ear.  You get a stiff neck.  You cannot move part of your face (paralysis).  You notice that the bone behind your ear hurts when you touch it. This information is not intended to replace advice given to you by your health care provider. Make sure you discuss any questions you have with your health care provider. Document Released: 01/16/2008 Document Revised: 01/05/2016 Document Reviewed: 02/24/2013 Elsevier Interactive Patient Education  2017 [employer].

## 2017-11-04 NOTE — Progress Notes (Signed)
Patient ID: Katrina [name], female    DOB: 02-Oct-1970, 47 y.o.   MRN: [id number]  PCP: Bing Neighbors, FNP  Chief Complaint  Patient presents with  . Diabetes Mellitus    Subjective:  Katrina [name] is a 47 y.o. female with type 2 diabetes-very poorly controlled, presents for evaluation of diabetes. Katrina [name] was seen in the office on 10/21/2017 for evaluation of diabetes. During that visit, Katrina [name] had an A1C 11.1. She had been out of medication for months and was resumed on Metformin 1000 mg twice daily and started on Lantus 20 units. Today she reports her blood sugar readings have remained uncontrolled. Readings have ranged 196 with a high of 400. She is checking blood sugars in the morning fasting and in the evening. She denies visual disturbance, shortness of breath, chest pain, dizziness, or weakness. Katrina [name] complains of left inner ear pain that radiates down to left jaw. Denies eye drainage, fever, or congestion. Social History   Socioeconomic History  . Marital status: Married    Spouse name: Not on file  . Number of children: Not on file  . Years of education: Not on file  . Highest education level: Not on file  Occupational History  . Not on file  Social Needs  . Financial resource strain: Not on file  . Food insecurity:    Worry: Not on file    Inability: Not on file  . Transportation needs:    Medical: Not on file    Non-medical: Not on file  Tobacco Use  . Smoking status: Never Smoker  . Smokeless tobacco: Never Used  Substance and Sexual Activity  . Alcohol use: No  . Drug use: No  . Sexual activity: Yes    Birth control/protection: Condom  Lifestyle  . Physical activity:    Days per week: Not on file    Minutes per session: Not on file  . Stress: Not on file  Relationships  . Social connections:    Talks on phone: Not on file    Gets together: Not on file    Attends religious service: Not on file    Active member of club or organization: Not on file     Attends meetings of clubs or organizations: Not on file    Relationship status: Not on file  . Intimate partner violence:    Fear of current or ex partner: Not on file    Emotionally abused: Not on file    Physically abused: Not on file    Forced sexual activity: Not on file  Other Topics Concern  . Not on file  Social History Narrative   ** Merged History Encounter **        Family History  Problem Relation Age of Onset  . Diabetes Mother   . Diabetes Father   . Diabetes Sister   . Diabetes Brother    Review of Systems Pertinent negatives listed in Katrina  Patient Active Problem List   Diagnosis Date Noted  . Chest pain 12/01/2016  . Essential hypertension 12/01/2016  . Dyslipidemia 10/04/2014  . Diabetes mellitus type 2, uncontrolled (HCC) 10/04/2014  . Hyperglycemia 10/01/2014  . Other fatigue 10/01/2014  . Peri-menopause 10/01/2014  . Language barrier to communication 10/01/2014    No Known Allergies  Prior to Admission medications   Medication Sig Start Date End Date Taking? Authorizing Provider  aspirin EC 81 MG EC tablet Take 1 tablet (81 mg total) by mouth daily. 12/02/16  Yes Pearson Grippe,  MD  butalbital-aspirin-caffeine [facility]) 50-325-40 MG capsule Take 1 capsule by mouth every 6 (six) hours as needed for headache. 10/21/17  Yes Bing Neighbors, FNP  glipiZIDE (GLUCOTROL) 5 MG tablet Take 1 tablet (5 mg total) by mouth daily before breakfast. 12/12/16  Yes Bing Neighbors, FNP  hydrOXYzine (ATARAX/VISTARIL) 25 MG tablet Take 0.5-1 tablets (12.5-25 mg total) by mouth every 8 (eight) hours as needed for itching. 12/28/16  Yes Bing Neighbors, FNP  lisinopril (PRINIVIL,ZESTRIL) 5 MG tablet Take 0.5 tablets (2.5 mg total) by mouth daily. 10/21/17  Yes Bing Neighbors, FNP  metFORMIN (GLUCOPHAGE) 500 MG tablet Take 2 tablets (1,000 mg total) by mouth 2 (two) times daily with a meal. 10/21/17  Yes Bing Neighbors, FNP  pravastatin (PRAVACHOL) 40 MG tablet Take 1  tablet (40 mg total) by mouth daily. 10/21/17  Yes Bing Neighbors, FNP  Syringe/Needle, Disp, (SYRINGE 3CC/27GX1-1/4") 27G X 1-1/4" 3 ML MISC Use 1 syringe to inject subcutaneus dose of insulin PRN 10/21/17  Yes Bing Neighbors, FNP  betamethasone dipropionate (DIPROLENE) 0.05 % cream Apply topically 2 (two) times daily. Patient not taking: Reported on 11/04/2017 12/28/16   Bing Neighbors, FNP  insulin glargine (LANTUS) 100 UNIT/ML injection Inject 0.2 mLs (20 Units total) into the skin at bedtime. Patient not taking: Reported on 11/04/2017 10/21/17   Bing Neighbors, FNP  saxagliptin HCl (ONGLYZA) 5 MG TABS tablet Take 1 tablet (5 mg total) by mouth daily. Patient not taking: Reported on 11/04/2017 12/12/16   Bing Neighbors, FNP    Past Medical, Surgical Family and Social History reviewed and updated.    Objective:   Today's Vitals   11/04/17 1517  BP: 118/72  Pulse: 80  Temp: 98.6 F (37 C)  TempSrc: Oral  SpO2: 100%  Weight: 170 lb 3.2 oz (77.2 kg)  Height: 5\' 5"  (1.651 m)    Wt Readings from Last 3 Encounters:  11/04/17 170 lb 3.2 oz (77.2 kg)  10/21/17 171 lb (77.6 kg)  12/28/16 176 lb (79.8 kg)    Physical Exam Constitutional: Patient appears well-developed and well-nourished. No distress. HENT: Normocephalic, atraumatic, External right and left ear normal. Oropharynx is clear and moist.  Eyes: Conjunctivae and EOM are normal. PERRLA, no scleral icterus. Neck: Normal ROM. Neck supple. No JVD. No tracheal deviation. No thyromegaly. CVS: RRR, S1/S2 +, no murmurs, no gallops, no carotid bruit.  Pulmonary: Effort and breath sounds normal, no stridor, rhonchi, wheezes, rales.  Abdominal: Soft. BS +, no distension, tenderness, rebound or guarding.  Musculoskeletal: Normal range of motion. No edema and no tenderness.  Neuro: Alert. Normal reflexes, muscle tone coordination.  Skin: Skin is warm and dry. No rash noted. Not diaphoretic. No erythema. No  pallor. Psychiatric: Normal mood and affect. Behavior, judgment, thought content normal.   Assessment & Plan:  1. Type 2 diabetes mellitus without complication, without long-term current use of insulin (HCC), A1C 11.2.  Blood sugars remain elevated in spite of recent addition of Lantus. Increasing Lantus 30 units at bedtime with food. Adding glipizide 5 mg once daily with breakfast.  2. Otitis of Left Ear , will start Augmentin 875-125 mg twice daily x 10 days.  Orders Placed This Encounter  Procedures  . POCT glycosylated hemoglobin (Hb A1C)  . POCT glucose (manual entry)  . POCT urinalysis dip (device)    Meds ordered this encounter  Medications  . insulin glargine (LANTUS) 100 UNIT/ML injection    Sig: Inject 0.3  mLs (30 Units total) into the skin at bedtime.    Dispense:  10 mL    Refill:  11    Dose change only    Order Specific Question:   Supervising Provider    Answer:   Quentin AngstJEGEDE, OLUGBEMIGA E [id number]]  . glipiZIDE (GLUCOTROL) 5 MG tablet    Sig: Take 1 tablet (5 mg total) by mouth daily before breakfast.    Dispense:  90 tablet    Refill:  1    Order Specific Question:   Supervising Provider    Answer:   Quentin AngstJEGEDE, OLUGBEMIGA E [id number]]  . amoxicillin-clavulanate (AUGMENTIN) 875-125 MG tablet    Sig: Take 1 tablet by mouth 2 (two) times daily.    Dispense:  20 tablet    Refill:  0    Order Specific Question:   Supervising Provider    Answer:   Quentin AngstJEGEDE, OLUGBEMIGA E [[id number]]    RTC: 6 weeks for diabetes follow-up   Godfrey PickKimberly S. Tiburcio PeaHarris, MSN, FNP-C [facility]  [address] [place], [place] [id number] [phone]

## 2017-12-09 MED FILL — PRAVASTATIN NA 40 MG TAB: 40 | 30 days supply | Qty: 30 | Fill #1

## 2017-12-09 MED FILL — LISINOPRIL 5 MG TAB: 5 | 30 days supply | Qty: 15 | Fill #1

## 2017-12-09 MED FILL — LANTUS 100 UNITS/ML VIAL: 100 | 28 days supply | Qty: 10 | Fill #1

## 2017-12-09 MED FILL — metFORMIN HCL 500 MG TABS: 500 | 30 days supply | Qty: 120 | Fill #1

## 2017-12-09 MED FILL — glipiZIDE 5 MG TABS: 5 | 30 days supply | Qty: 30 | Fill #1

## 2017-12-16 ENCOUNTER — Ambulatory Visit: Payer: Self-pay | Admitting: Family Medicine

## 2018-03-21 MED FILL — glipiZIDE 5 MG TABS: 5 | 30 days supply | Qty: 30 | Fill #2

## 2018-03-21 MED FILL — metFORMIN HCL 500 MG TABS: 500 | 30 days supply | Qty: 120 | Fill #2

## 2018-03-21 MED FILL — LANTUS 100 UNITS/ML VIAL: 100 | 28 days supply | Qty: 10 | Fill #2

## 2018-03-21 MED FILL — PRAVASTATIN NA 40 MG TAB: 40 | 30 days supply | Qty: 30 | Fill #2

## 2018-03-27 MED FILL — LISINOPRIL 5 MG TAB: 5 | 30 days supply | Qty: 15 | Fill #2

## 2018-05-14 ENCOUNTER — Ambulatory Visit (HOSPITAL_COMMUNITY)
Admission: EM | Admit: 2018-05-14 | Discharge: 2018-05-14 | Disposition: A | Discharge status: Home / Self Care | Payer: Self-pay | Attending: Family Medicine | Admitting: Family Medicine

## 2018-05-14 ENCOUNTER — Encounter (HOSPITAL_COMMUNITY): Payer: Self-pay | Admitting: Emergency Medicine

## 2018-05-14 DIAGNOSIS — S61011A Laceration without foreign body of right thumb without damage to nail, initial encounter: Secondary | ICD-10-CM

## 2018-05-14 DIAGNOSIS — W268XXA Contact with other sharp object(s), not elsewhere classified, initial encounter: Secondary | ICD-10-CM

## 2018-05-14 MED ORDER — LIDOCAINE HCL (PF) 2 % IJ SOLN
INTRAMUSCULAR | Status: AC
  Filled 2018-05-14: qty 2

## 2018-05-14 MED ORDER — CEPHALEXIN 500 MG PO CAPS: 500.0000 mg | ORAL_CAPSULE | Freq: Four times a day (QID) | ORAL | 0 refills | Status: AC

## 2018-05-14 MED ORDER — IBUPROFEN 800 MG PO TABS: 800.0000 mg | ORAL_TABLET | Freq: Three times a day (TID) | ORAL | 0 refills | Status: AC

## 2018-05-14 NOTE — ED Provider Notes (Signed)
[facility]    CSN: [id number] Arrival date & time: 05/14/18  1309     History   Chief Complaint Chief Complaint  Patient presents with  . Extremity Laceration    HPI Katrina [name] is a 47 y.o. female.   Katrina [name] presents with complaints of laceration to right thumb. While she was cleaning in an oven there was a metal piece which cut her finger. She cleansed it after it occurred. Did have significant bleeding. Went to another clinic, tdap updated, was told to come here for evaluation. Tingling to thumb, no numbness. Still with pain. Family provides spanish interpretation as needed. No active bleeding. Hx of htn, dm.     ROS per HPI.      Past Medical History:  Diagnosis Date  . Diabetes mellitus without complication (HCC)   . Hypertension     Patient Active Problem List   Diagnosis Date Noted  . Chest pain 12/01/2016  . Essential hypertension 12/01/2016  . Dyslipidemia 10/04/2014  . Diabetes mellitus type 2, uncontrolled (HCC) 10/04/2014  . Hyperglycemia 10/01/2014  . Other fatigue 10/01/2014  . Peri-menopause 10/01/2014  . Language barrier to communication 10/01/2014    Past Surgical History:  Procedure Laterality Date  . HERNIA REPAIR      OB History    Gravida  0   Para  0   Term  0   Preterm  0   AB  0   Living        SAB  0   TAB  0   Ectopic  0   Multiple      Live Births               Home Medications    Prior to Admission medications   Medication Sig Start Date End Date Taking? Authorizing Provider  aspirin EC 81 MG EC tablet Take 1 tablet (81 mg total) by mouth daily. 12/02/16   Pearson Grippe, MD  butalbital-aspirin-caffeine [facility]) [id number] MG capsule Take 1 capsule by mouth every 6 (six) hours as needed for headache. 10/21/17   Bing Neighbors, FNP  cephALEXin (KEFLEX) 500 MG capsule Take 1 capsule (500 mg total) by mouth 4 (four) times daily for 5 days. 05/14/18 05/19/18  Georgetta Haber, NP  glipiZIDE  (GLUCOTROL) 5 MG tablet Take 1 tablet (5 mg total) by mouth daily before breakfast. 11/04/17   Bing Neighbors, FNP  hydrOXYzine (ATARAX/VISTARIL) 25 MG tablet Take 0.5-1 tablets (12.5-25 mg total) by mouth every 8 (eight) hours as needed for itching. 12/28/16   Bing Neighbors, FNP  ibuprofen (ADVIL,MOTRIN) 800 MG tablet Take 1 tablet (800 mg total) by mouth 3 (three) times daily. 05/14/18   Linus Mako B, NP  insulin glargine (LANTUS) 100 UNIT/ML injection Inject 0.3 mLs (30 Units total) into the skin at bedtime. 11/04/17   Bing Neighbors, FNP  lisinopril (PRINIVIL,ZESTRIL) 5 MG tablet Take 0.5 tablets (2.5 mg total) by mouth daily. 10/21/17   Bing Neighbors, FNP  metFORMIN (GLUCOPHAGE) 500 MG tablet Take 2 tablets (1,000 mg total) by mouth 2 (two) times daily with a meal. 10/21/17   Bing Neighbors, FNP  pravastatin (PRAVACHOL) 40 MG tablet Take 1 tablet (40 mg total) by mouth daily. 10/21/17   Bing Neighbors, FNP  Syringe/Needle, Disp, (SYRINGE 3CC/27GX1-1/4") 27G X 1-1/4" 3 ML MISC Use 1 syringe to inject subcutaneus dose of insulin PRN 10/21/17   Bing Neighbors, FNP    Family History  Family History  Problem Relation Age of Onset  . Diabetes Mother   . Diabetes Father   . Diabetes Sister   . Diabetes Brother     Social History Social History   Tobacco Use  . Smoking status: Never Smoker  . Smokeless tobacco: Never Used  Substance Use Topics  . Alcohol use: No  . Drug use: No     Allergies   Patient has no known allergies.   Review of Systems Review of Systems   Physical Exam Triage Vital Signs ED Triage Vitals [05/14/18 1426]  Enc Vitals Group     BP 128/71     Pulse Rate (!) 52     Resp 18     Temp (!) 97.5 F (36.4 C)     Temp Source Oral     SpO2 100 %     Weight      Height      Head Circumference      Peak Flow      Pain Score      Pain Loc      Pain Edu?      Excl. in GC?    No data found.  Updated Vital Signs BP 128/71 (BP  Location: Left Arm)   Pulse (!) 52   Temp (!) 97.5 F (36.4 C) (Oral)   Resp 18   LMP 10/23/2016   SpO2 100%   Visual Acuity Right Eye Distance:   Left Eye Distance:   Bilateral Distance:    Right Eye Near:   Left Eye Near:    Bilateral Near:     Physical Exam  Constitutional: She is oriented to person, place, and time. She appears well-developed and well-nourished. No distress.  Cardiovascular: Normal rate, regular rhythm and normal heart sounds.  Pulmonary/Chest: Effort normal and breath sounds normal.  Musculoskeletal:       Right hand: She exhibits tenderness and laceration. She exhibits normal range of motion, no bony tenderness, normal two-point discrimination, normal capillary refill, no deformity and no swelling. Normal sensation noted. Normal strength noted.       Hands: Approximately 1.5 cm avulsion with skin flap to right proximal thumb, distal and medial to MCP joint; strength of finger in all directions intact; sensation intact; cap refill < 2 seconds; no active bleeding; approximately 2 mm in depth; no underlying structure damage, no FB  Neurological: She is alert and oriented to person, place, and time.  Skin: Skin is warm and dry.     UC Treatments / Results  Labs (all labs ordered are listed, but only abnormal results are displayed) Labs Reviewed - No data to display  EKG None  Radiology No results found.  Procedures Laceration Repair Date/Time: 05/14/2018 3:45 PM Performed by: Georgetta Haber, NP Authorized by: Mardella Layman, MD   Consent:    Consent obtained:  Verbal   Consent given by:  Patient   Risks discussed:  Infection, need for additional repair, pain, poor cosmetic result and poor wound healing   Alternatives discussed:  No treatment, observation, referral and delayed treatment Anesthesia (see MAR for exact dosages):    Anesthesia method:  Local infiltration   Local anesthetic:  Lidocaine 2% w/o epi Laceration details:    Location:   Finger   Finger location:  R thumb   Length (cm):  1.5   Depth (mm):  2 Repair type:    Repair type:  Simple Exploration:    Wound exploration: wound explored through full range  of motion and entire depth of wound probed and visualized     Contaminated: no   Treatment:    Area cleansed with:  Betadine and Shur-Clens   Amount of cleaning:  Standard Skin repair:    Repair method:  Steri-Strips   Number of Steri-Strips:  6 Approximation:    Approximation:  Close (avulsion with skin flap which covered entire wound bed ) Post-procedure details:    Dressing:  Bulky dressing   Patient tolerance of procedure:  Tolerated well, no immediate complications Comments:     Local numbing provided followed by thorough cleansing of wound under skin flap, thoroughly explored; steri strips then used to on skin flap to keep it in place, and gauze and coban dressing applied.    (including critical care time)  Medications Ordered in UC Medications - No data to display  Initial Impression / Assessment and Plan / UC Course  I have reviewed the triage vital signs and the nursing notes.  Pertinent labs & imaging results that were available during my care of the patient were reviewed by me and considered in my medical decision making (see chart for details).     Skin flap intact and in place covering entirety of wound. Steri strips and dressing with wound well approximated. Will cover with 5 days of keflex as she is diabetic and occurred cleaning an oven. tdap previously updated per patient. Wound care education provided. Return precautions provided. Patient verbalized understanding and agreeable to plan.   Final Clinical Impressions(s) / UC Diagnoses   Final diagnoses:  Laceration of right thumb without foreign body without damage to nail, initial encounter     Discharge Instructions     Keep this dressing on for 24 hours.  Keep the steri strips on as long as they will stay.  Once they fall off  wash wound with soap and water daily, keep covered to keep clean.  5 days of antibiotics.  Ibuprofen for pain control, ice, elevation.  If develop increased redness, pain, drainage/pus, please be seen again.     ED Prescriptions    Medication Sig Dispense Auth. Provider   cephALEXin (KEFLEX) 500 MG capsule Take 1 capsule (500 mg total) by mouth 4 (four) times daily for 5 days. 20 capsule Linus Mako B, NP   ibuprofen (ADVIL,MOTRIN) 800 MG tablet Take 1 tablet (800 mg total) by mouth 3 (three) times daily. 21 tablet Georgetta Haber, NP     Controlled Substance Prescriptions [place] Controlled Substance Registry consulted? Not Applicable   Georgetta Haber, NP 05/14/18 1550

## 2018-05-14 NOTE — ED Triage Notes (Signed)
Pt sts right thumb laceration today at work; bleeding controlled

## 2018-05-14 NOTE — Discharge Instructions (Signed)
Keep this dressing on for 24 hours.  Keep the steri strips on as long as they will stay.  Once they fall off wash wound with soap and water daily, keep covered to keep clean.  5 days of antibiotics.  Ibuprofen for pain control, ice, elevation.  If develop increased redness, pain, drainage/pus, please be seen again.

## 2018-10-28 IMAGING — CR DG CHEST 2V
2 series · 2 of 2 positions shown · non-contrast
Comparison: None.

CLINICAL DATA: Acute onset of central and left-sided chest pain,
radiating to the left neck and left side of the jaw. Shortness of
breath. Dizziness. Initial encounter.

EXAM:
CHEST  2 VIEW

[chest pa]
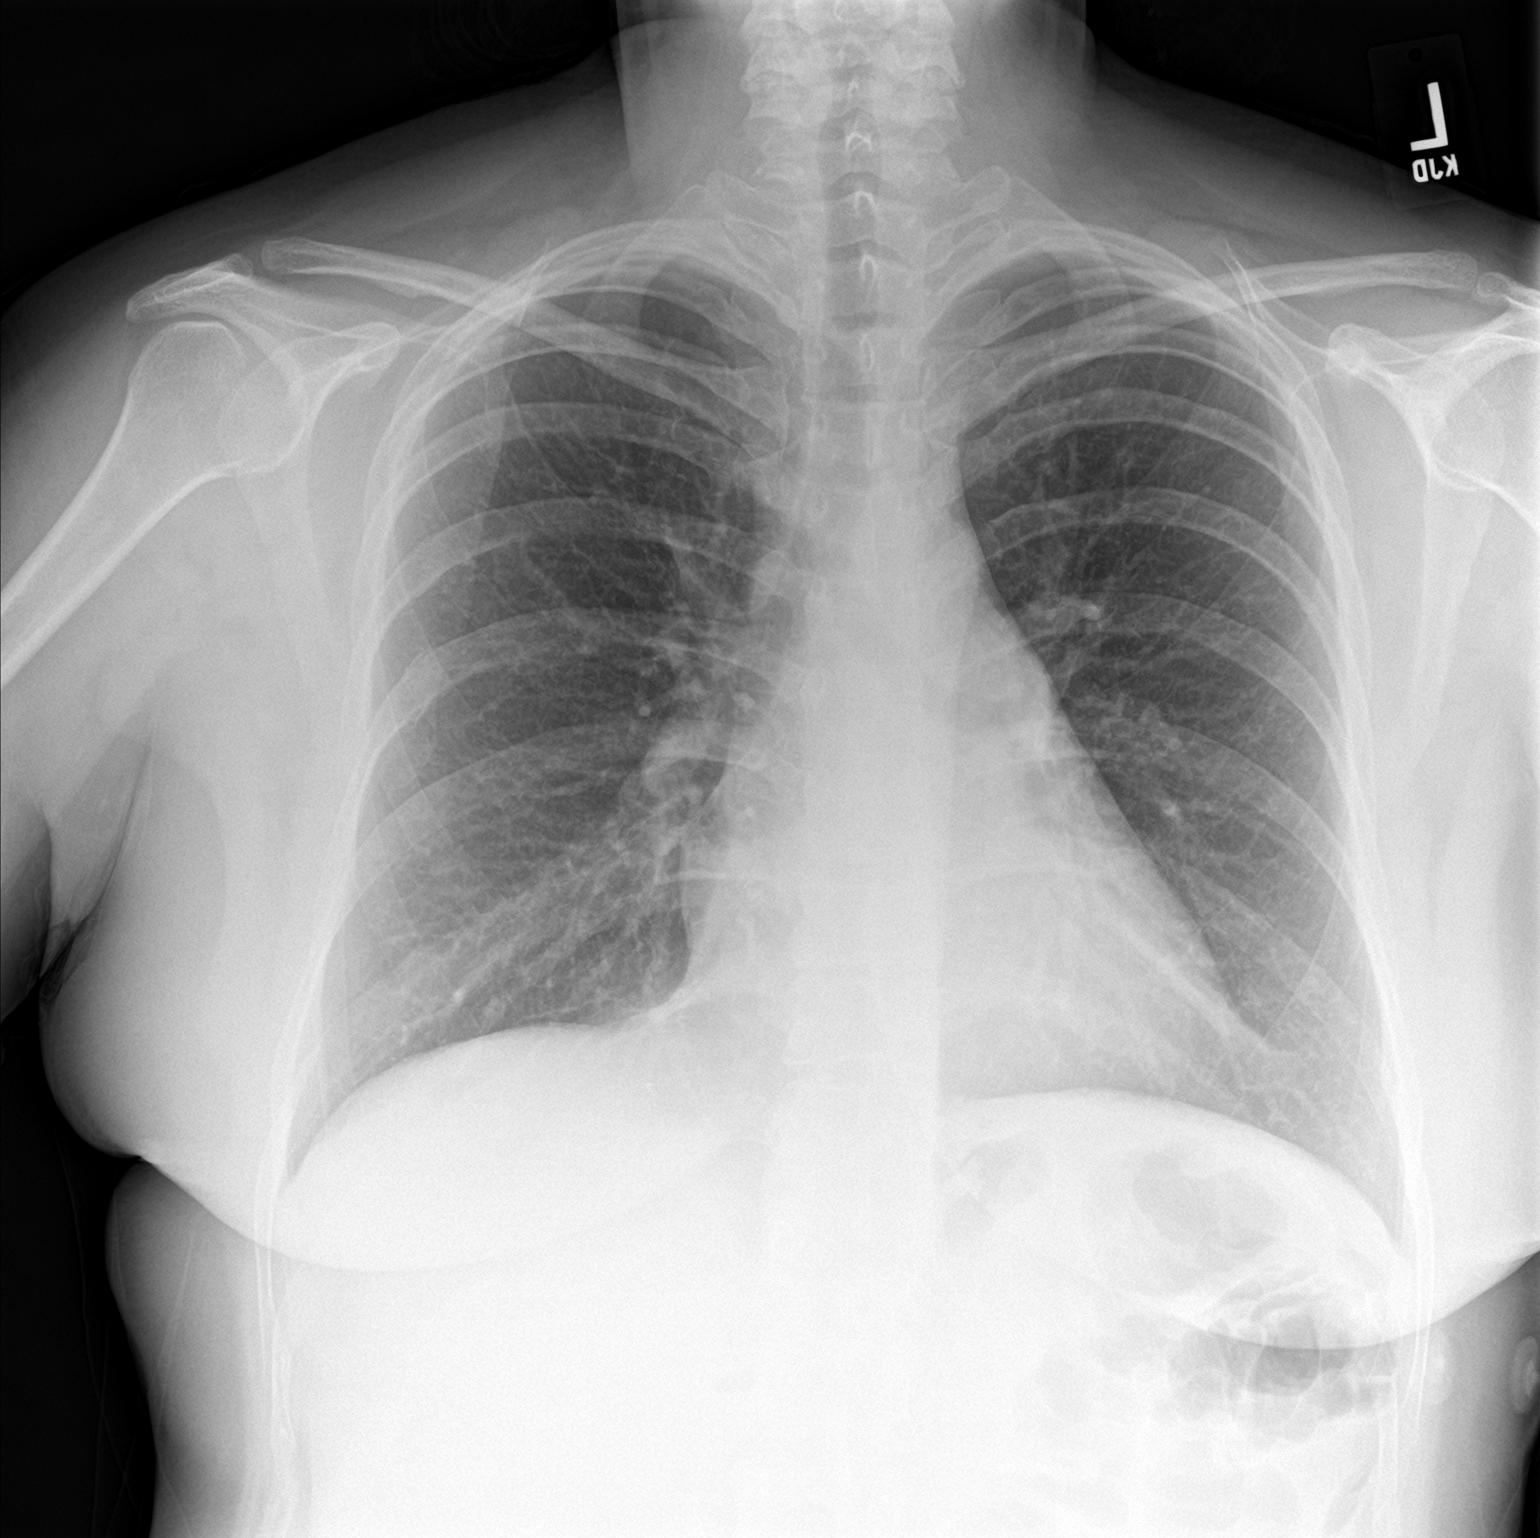

[chest lat]
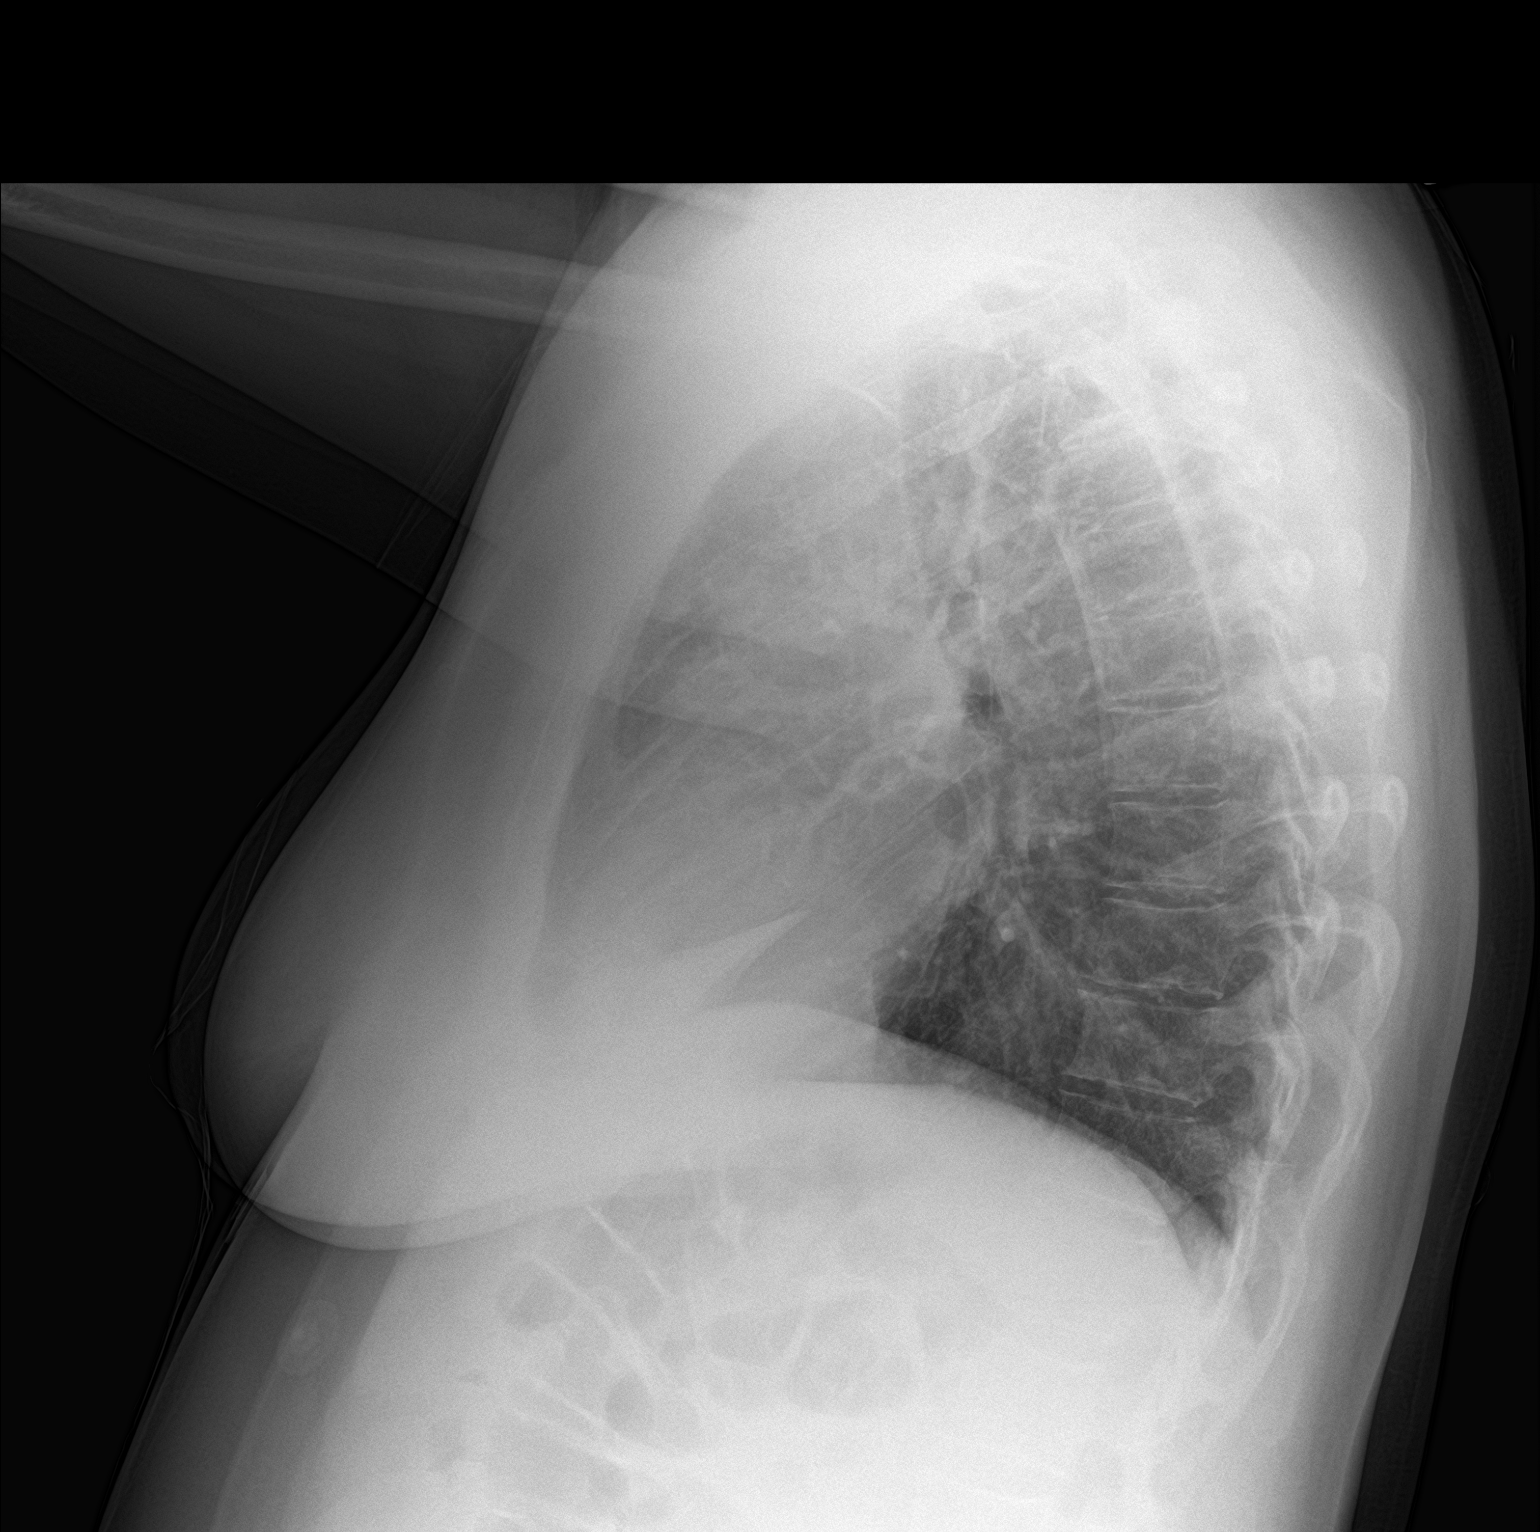

[2 of 2 positions shown; findings below may reference images not displayed]

FINDINGS: The lungs are well-aerated and clear. There is no evidence of focal
opacification, pleural effusion or pneumothorax.

The heart is normal in size; the mediastinal contour is within
normal limits. No acute osseous abnormalities are seen.
IMPRESSION: No acute cardiopulmonary process seen.
